# Patient Record
Sex: Female | Born: 1960
Health system: Southern US, Community
[De-identification: ages and names within clinical notes are randomized; demographics above are authoritative.]

## PROBLEM LIST (undated history)

## (undated) DIAGNOSIS — K529 Noninfective gastroenteritis and colitis, unspecified: Secondary | ICD-10-CM

## (undated) DIAGNOSIS — J302 Other seasonal allergic rhinitis: Secondary | ICD-10-CM

## (undated) DIAGNOSIS — M1990 Unspecified osteoarthritis, unspecified site: Secondary | ICD-10-CM

## (undated) DIAGNOSIS — F32A Depression, unspecified: Secondary | ICD-10-CM

## (undated) HISTORY — PX: HYSTEROSCOPY: SHX211

## (undated) HISTORY — PX: COLONOSCOPY: SHX174

## (undated) HISTORY — DX: Other seasonal allergic rhinitis: J30.2

## (undated) HISTORY — DX: Noninfective gastroenteritis and colitis, unspecified: K52.9

## (undated) HISTORY — DX: Depression, unspecified: F32.A

---

## 1997-09-24 ENCOUNTER — Other Ambulatory Visit: Admission: RE | Admit: 1997-09-24 | Discharge: 1997-09-24 | Payer: Self-pay | Admitting: Gynecology

## 1997-10-27 ENCOUNTER — Other Ambulatory Visit: Admission: RE | Admit: 1997-10-27 | Discharge: 1997-10-27 | Payer: Self-pay | Admitting: Gynecology

## 1997-12-23 ENCOUNTER — Ambulatory Visit (HOSPITAL_COMMUNITY): Admission: RE | Admit: 1997-12-23 | Discharge: 1997-12-23 | Payer: Self-pay | Admitting: Gynecology

## 1998-11-01 ENCOUNTER — Other Ambulatory Visit: Admission: RE | Admit: 1998-11-01 | Discharge: 1998-11-01 | Payer: Self-pay | Admitting: Gynecology

## 2000-01-12 ENCOUNTER — Other Ambulatory Visit: Admission: RE | Admit: 2000-01-12 | Discharge: 2000-01-12 | Payer: Self-pay | Admitting: Gynecology

## 2000-07-03 ENCOUNTER — Other Ambulatory Visit: Admission: RE | Admit: 2000-07-03 | Discharge: 2000-07-03 | Payer: Self-pay | Admitting: Gynecology

## 2000-07-23 ENCOUNTER — Encounter (INDEPENDENT_AMBULATORY_CARE_PROVIDER_SITE_OTHER): Payer: Self-pay

## 2000-07-23 ENCOUNTER — Ambulatory Visit (HOSPITAL_COMMUNITY): Admission: RE | Admit: 2000-07-23 | Discharge: 2000-07-23 | Payer: Self-pay | Admitting: Gastroenterology

## 2001-08-11 ENCOUNTER — Encounter: Payer: Self-pay | Admitting: Family Medicine

## 2001-08-11 ENCOUNTER — Encounter: Admission: RE | Admit: 2001-08-11 | Discharge: 2001-08-11 | Payer: Self-pay | Admitting: Family Medicine

## 2001-08-19 ENCOUNTER — Ambulatory Visit (HOSPITAL_COMMUNITY): Admission: RE | Admit: 2001-08-19 | Discharge: 2001-08-19 | Payer: Self-pay | Admitting: Gastroenterology

## 2001-08-19 ENCOUNTER — Encounter (INDEPENDENT_AMBULATORY_CARE_PROVIDER_SITE_OTHER): Payer: Self-pay

## 2001-08-28 ENCOUNTER — Encounter: Admission: RE | Admit: 2001-08-28 | Discharge: 2001-11-26 | Payer: Self-pay | Admitting: Family Medicine

## 2001-10-06 ENCOUNTER — Other Ambulatory Visit: Admission: RE | Admit: 2001-10-06 | Discharge: 2001-10-06 | Payer: Self-pay | Admitting: Gynecology

## 2002-01-07 ENCOUNTER — Encounter: Admission: RE | Admit: 2002-01-07 | Discharge: 2002-01-07 | Payer: Self-pay | Admitting: Family Medicine

## 2002-01-07 ENCOUNTER — Encounter: Payer: Self-pay | Admitting: Family Medicine

## 2005-02-10 ENCOUNTER — Emergency Department (HOSPITAL_COMMUNITY): Admission: EM | Admit: 2005-02-10 | Discharge: 2005-02-10 | Payer: Self-pay | Admitting: Emergency Medicine

## 2005-02-13 ENCOUNTER — Other Ambulatory Visit: Admission: RE | Admit: 2005-02-13 | Discharge: 2005-02-13 | Payer: Self-pay | Admitting: Gynecology

## 2006-02-14 ENCOUNTER — Other Ambulatory Visit: Admission: RE | Admit: 2006-02-14 | Discharge: 2006-02-14 | Payer: Self-pay | Admitting: Gynecology

## 2006-09-04 ENCOUNTER — Other Ambulatory Visit: Admission: RE | Admit: 2006-09-04 | Discharge: 2006-09-04 | Payer: Self-pay | Admitting: Gynecology

## 2007-10-02 ENCOUNTER — Other Ambulatory Visit: Admission: RE | Admit: 2007-10-02 | Discharge: 2007-10-02 | Payer: Self-pay | Admitting: Gynecology

## 2008-01-16 ENCOUNTER — Ambulatory Visit (HOSPITAL_BASED_OUTPATIENT_CLINIC_OR_DEPARTMENT_OTHER): Admission: RE | Admit: 2008-01-16 | Discharge: 2008-01-16 | Payer: Self-pay | Admitting: Gynecology

## 2008-01-16 ENCOUNTER — Encounter: Payer: Self-pay | Admitting: Gynecology

## 2008-04-15 ENCOUNTER — Encounter: Payer: Self-pay | Admitting: Women's Health

## 2008-04-15 ENCOUNTER — Ambulatory Visit: Payer: Self-pay | Admitting: Women's Health

## 2008-04-15 ENCOUNTER — Other Ambulatory Visit: Admission: RE | Admit: 2008-04-15 | Discharge: 2008-04-15 | Payer: Self-pay | Admitting: Gynecology

## 2008-04-18 HISTORY — PX: ENDOMETRIAL ABLATION: SHX621

## 2008-05-10 ENCOUNTER — Ambulatory Visit: Payer: Self-pay | Admitting: Gynecology

## 2008-05-17 ENCOUNTER — Ambulatory Visit: Payer: Self-pay | Admitting: Gynecology

## 2008-06-04 ENCOUNTER — Ambulatory Visit: Payer: Self-pay | Admitting: Gynecology

## 2008-10-04 ENCOUNTER — Encounter: Payer: Self-pay | Admitting: Women's Health

## 2008-10-04 ENCOUNTER — Ambulatory Visit: Payer: Self-pay | Admitting: Women's Health

## 2008-10-04 ENCOUNTER — Other Ambulatory Visit: Admission: RE | Admit: 2008-10-04 | Discharge: 2008-10-04 | Payer: Self-pay | Admitting: Gynecology

## 2009-10-10 ENCOUNTER — Other Ambulatory Visit: Admission: RE | Admit: 2009-10-10 | Discharge: 2009-10-10 | Payer: Self-pay | Admitting: Gynecology

## 2009-10-10 ENCOUNTER — Ambulatory Visit: Payer: Self-pay | Admitting: Women's Health

## 2010-10-12 ENCOUNTER — Encounter: Payer: Self-pay | Admitting: Women's Health

## 2010-10-18 ENCOUNTER — Other Ambulatory Visit: Payer: Self-pay | Admitting: Women's Health

## 2010-10-18 ENCOUNTER — Other Ambulatory Visit (HOSPITAL_COMMUNITY)
Admission: RE | Admit: 2010-10-18 | Discharge: 2010-10-18 | Disposition: A | Discharge status: Home / Self Care | Payer: BC Managed Care – PPO | Source: Ambulatory Visit | Attending: Gynecology | Admitting: Gynecology

## 2010-10-18 ENCOUNTER — Encounter (INDEPENDENT_AMBULATORY_CARE_PROVIDER_SITE_OTHER): Payer: BC Managed Care – PPO | Admitting: Women's Health

## 2010-10-18 DIAGNOSIS — E079 Disorder of thyroid, unspecified: Secondary | ICD-10-CM

## 2010-10-18 DIAGNOSIS — Z13220 Encounter for screening for lipoid disorders: Secondary | ICD-10-CM

## 2010-10-18 DIAGNOSIS — Z01419 Encounter for gynecological examination (general) (routine) without abnormal findings: Secondary | ICD-10-CM

## 2010-10-18 DIAGNOSIS — Z124 Encounter for screening for malignant neoplasm of cervix: Secondary | ICD-10-CM | POA: Insufficient documentation

## 2010-10-18 DIAGNOSIS — Z833 Family history of diabetes mellitus: Secondary | ICD-10-CM

## 2010-10-31 NOTE — Op Note (Signed)
Michelle Huynh, Michelle Huynh                 ACCOUNT NO.:  [id number]   MEDICAL RECORD NO.:  [id number]          PATIENT TYPE:  AMB   LOCATION:  [facility]                         FACILITY:  [facility]   PHYSICIAN:  Timothy P. Fontaine, M.D.DATE OF BIRTH:  Sep 06, 1960   DATE OF PROCEDURE:  01/16/2008  DATE OF DISCHARGE:                               OPERATIVE REPORT   PREOPERATIVE DIAGNOSES:  Irregular bleeding endometrial polyp.   POSTOPERATIVE DIAGNOSES:  Irregular bleeding endometrial polyp.   PROCEDURE:  1. Hysteroscopy.  2. Dilation and curettage.  3. Removal of endometrial polyp.   SURGEON:  Timothy P. Fontaine, M.D.   COMPLICATIONS:  None.   ANESTHETIC:  General with 1% lidocaine paracervical block.   SPECIMEN:  1. Endometrial curetting  2. Endometrial polyp to pathology.   ESTIMATED BLOOD LOSS:  Minimal.  Sorbitol discrepancy reported 75 mL  although a fair amount noted on floor.   FINDINGS:  EUA external BUS of vagina, grossly normal cervix.  Normal  uterus, normal size, midline mobile.  Adnexa without masses.  Hysteroscopic with large endometrial polyp filling the endometrial  cavity, excised in its entirety.  Hysteroscopy otherwise normal noting  fundus anterior, posterior uterine surfaces, lower uterine segment  endocervical canal, right and left tubal ostia all visualized and  normal.   DESCRIPTION OF PROCEDURE:  The patient was taken to the operating room,  underwent general anesthesia and was placed in the low dorsal lithotomy  position, and received a perineal vaginal preparation with Betadine  solution.  Bladder emptied with in-and-out Foley catheterization.  EUA  performed, and the patient was draped in the usual fashion.  Cervix was  visualized with a surgical speculum.  Anterior lip grasped with a single-  tooth tenaculum and a paracervical block using 1% lidocaine.  A total of  9 mL was placed.  Cervix was gently and gradually dilated to admit the  operative  hysteroscope and hysteroscopy was performed with findings  noted above.  Using the right-angle resectoscopic loop, the endometrial  polyp was resected in its entirety at the junction of its attachment to  the surrounding endometrium.  A sharp curettage was then performed.  The  specimen sent separately.  Rehysteroscopy  showed an empty cavity, good distention, no evidence of perforation.  The patient received Toradol intraoperatively, and was placed in the  supine position, awakened without difficulty, and was taken to recovery  room in good condition having tolerated the procedure well.      Timothy P. Fontaine, M.D.  Electronically Signed     TPF/MEDQ  D:  01/16/2008  T:  01/16/2008  Job:  [id number]

## 2010-10-31 NOTE — H&P (Signed)
NAMECARLESHA, Michelle Huynh                 ACCOUNT NO.:  [id number]   MEDICAL RECORD NO.:  [id number]          PATIENT TYPE:  AMB   LOCATION:  [facility]                         FACILITY:  [facility]   PHYSICIAN:  Timothy P. Fontaine, M.D.DATE OF BIRTH:  [birth year]   DATE OF ADMISSION:  01/16/2008  DATE OF DISCHARGE:                              HISTORY & PHYSICAL   DATE OF ADMISSION:  January 16, 2008, at 7:30, [facility].   CHIEF COMPLAINT:  Irregular bleeding.   HISTORY OF PRESENT ILLNESS:  A 50 year old G0 female, vasectomy birth  control, notes periods were becoming progressively closer.  She  underwent ultrasound evaluation which showed a posterior wall  endometrial polyp measuring 15 x 14 x 7 mm.  She is admitted at this  time for hysteroscopy, D&C and removal of her polyp.  She does have a  past history of polypectomy in 1999.   PAST MEDICAL HISTORY:  Significant for colitis.   PAST SURGICAL HISTORY:  1. Hysteroscopy.  2. D&C.   CURRENT MEDICATIONS:  Asacol.   ALLERGIES:  FLAGYL.   REVIEW OF SYSTEMS:  Noncontributory.   SOCIAL HISTORY:  Noncontributory.   FAMILY HISTORY:  Noncontributory.   PHYSICAL EXAMINATION:  VITAL SIGNS:  Stable.  HEENT:  Normal.  LUNGS:  Clear.  CARDIAC:  Regular rate.  No rubs, murmurs or gallops.  ABDOMEN:  Benign.  PELVIC:  External BUS, vagina normal.  Cervix normal.  Uterus normal  size, midline and mobile, nontender.  Adnexa without masses or  tenderness.   ASSESSMENT:  A 50 year old G0, vasectomy birth control, recurrent  endometrial polyp, irregular bleeding for hysteroscopy, dilatation and  curettage.  I reviewed with the patient the proposed surgery, to include  the use of the hysteroscope, dilatation and curettage, use of the  resectoscope.  I reviewed the risks to include bleeding, transfusion,  infection, uterine perforation, damage to internal organs, including  bowel, bladder, ureters, vessels and nerves, necessitating  major  exploratory reparative surgeries, future reparative surgeries, bowel  resection, ostomy formation all reviewed, understood and accepted.  The  potential for distended media absorption leading to metabolic  complications such as seizure and coma were also discussed, understood  and accepted.  The patient was given a  prescription for Darvocet-N 100 #20 one to two p.o. q.4-6 h., p.r.n.  pain for postoperative relief, as well as Cytotec 200 mcg tablet be  placed the evening before procedure to assist with cervical dilatation.  The patient's questions were answered to her satisfaction.  She is ready  to proceed with surgery.      Timothy P. Fontaine, M.D.  Electronically Signed     TPF/MEDQ  D:  01/14/2008  T:  01/14/2008  Job:  [id number]

## 2011-01-09 ENCOUNTER — Telehealth: Payer: Self-pay

## 2011-01-09 NOTE — Telephone Encounter (Signed)
PT. STATES X 2 MONTHS GENERIC PROZAC 10MG  DOES NOT SEEM TO BE HELPING AT ALL AND WANTS TO DISCUSS INCREASED DOSE OR CHANGE TO A DIFFERENT RX. WILL TRY AT LOCAL PHARMACY. IF WORKS WELL WILL NOTIFY TO SEND NEW RX TO [facility].

## 2011-01-10 ENCOUNTER — Telehealth: Payer: Self-pay | Admitting: Women's Health

## 2011-01-10 DIAGNOSIS — F329 Major depressive disorder, single episode, unspecified: Secondary | ICD-10-CM

## 2011-01-10 DIAGNOSIS — F32A Depression, unspecified: Secondary | ICD-10-CM

## 2011-01-10 MED ORDER — FLUOXETINE HCL 20 MG PO CAPS: 20.0000 mg | ORAL_CAPSULE | Freq: Every day | ORAL | Status: DC

## 2011-01-10 NOTE — Telephone Encounter (Signed)
Yes thanks 

## 2011-01-10 NOTE — Telephone Encounter (Signed)
Telephone call to the patient to review medication request. She has been on Prozac 10 mg for about 2 years and she states it is helped. States it is not as effective as at once was. Denies any feelings of harming herself or other situational stressors at this time. Denies need for counseling at this time. Will increase Prilosec to 20 mg by mouth daily and will get called in to her pharmacy. Will call if no relief with the increased dose. Encouraged to relax more exercise, and care for self better. We'll call with no relief.

## 2011-01-10 NOTE — Telephone Encounter (Signed)
IN YOUR DOCUMENTATION YOU MENTIONED INCREASING PRILOSEC TO 20 MG. DID YOU MEAN THE PROZAC RX?

## 2011-02-02 ENCOUNTER — Other Ambulatory Visit: Payer: Self-pay

## 2011-02-02 DIAGNOSIS — F329 Major depressive disorder, single episode, unspecified: Secondary | ICD-10-CM

## 2011-02-02 MED ORDER — FLUOXETINE HCL 20 MG PO CAPS: 20.0000 mg | ORAL_CAPSULE | Freq: Every day | ORAL | Status: DC

## 2011-02-02 NOTE — Telephone Encounter (Signed)
PLEASE SIGN FAX & HAVE DR. FONTAINE SIGN NEW DOSE OF RX DUE TO [place] LAW WHERE [facility] IS LOCATED.

## 2011-02-02 NOTE — Telephone Encounter (Signed)
PER NANCY AND DR. FONTAINE, THEY SIGNED RX AND I FAXED IT TO [facility] AT [phone].

## 2011-04-16 ENCOUNTER — Telehealth: Payer: Self-pay | Admitting: *Deleted

## 2011-04-16 NOTE — Telephone Encounter (Signed)
Amy, please call and have her take Valtrex 500 twice daily when she has fever blisters. Instruct  her to take immediately when she feels one starting to help prevent it from forming. Please call in Valtrex 500 twice a day 3-5 days when necessary #30.  Office visit if no relief.

## 2011-04-16 NOTE — Telephone Encounter (Signed)
Patient called c/o "canker sores" over the past couple weeks that keeps coming back.  Wants to get some advice on this please.

## 2011-04-17 MED ORDER — VALACYCLOVIR HCL 500 MG PO TABS: 500.0000 mg | ORAL_TABLET | Freq: Two times a day (BID) | ORAL | Status: AC

## 2011-04-17 NOTE — Telephone Encounter (Signed)
Patient informed. Rx sent 

## 2011-04-20 ENCOUNTER — Telehealth: Payer: Self-pay

## 2011-04-20 DIAGNOSIS — K529 Noninfective gastroenteritis and colitis, unspecified: Secondary | ICD-10-CM | POA: Insufficient documentation

## 2011-04-20 NOTE — Telephone Encounter (Signed)
Left msg pt to call me

## 2011-04-20 NOTE — Telephone Encounter (Signed)
Patient called on 04/16/11 and was prescribed Valtrex 500 bid to help with cold sore.  She said that she is no better after using the Valtrex. She asked if you could recommend anything for the pain as "it is in a bad spot".  Tylenol not helping.

## 2011-04-20 NOTE — Telephone Encounter (Signed)
Telephone call to review problem. States has had mouth ulcers throughout mouth off and on for the last 3 or 4 months. Has never had HSV. Has had colitis but states that is under control at this time. Did review to avoid acidic foods, carbonated, mouthwashes with any alcohol. States her life is good, has had no recent illnesses or increased stressors. Will watch at this time, if they persist office visit.

## 2011-04-20 NOTE — Telephone Encounter (Signed)
Please call patient :Motrin may be a better choice for pain relief since it is also and anti-inflammatory. Could also try Zovirax 5% ointment to or 3 times per day, if no relief office visit.  Please call in Motrin 600 by mouth every 8 hours when necessary #60 no refills. Zovirax 5% ointment 2-3 times per day when necessary #1 tube

## 2011-04-20 NOTE — Telephone Encounter (Signed)
Patient clarified today what she calls "canker sores" are the little while ulcers inside of her mouth. She does not have a cold sore on her lips.  She mentioned the pharmacist said he had never heard of prescribing Valtrex for it.  I told her she was correct and there was some confusion when she called in canker sore that it was thought she had cold sore. I told her not to use the Valtrex for that anymore.  She cannot take Motrin because of her colitis.  We discussed Oragel, she tried [employer], etc. And I told her that there is not one definitive treatment for those little mouth ulcers.  She said I only needed to call her back if [place] had any other suggestions.

## 2011-05-15 ENCOUNTER — Encounter: Payer: Self-pay | Admitting: Women's Health

## 2011-10-24 ENCOUNTER — Ambulatory Visit (INDEPENDENT_AMBULATORY_CARE_PROVIDER_SITE_OTHER): Payer: BC Managed Care – PPO | Admitting: Women's Health

## 2011-10-24 ENCOUNTER — Encounter: Payer: Self-pay | Admitting: Women's Health

## 2011-10-24 VITALS — BP 118/78 | Ht 63.5 in | Wt 180.5 lb

## 2011-10-24 DIAGNOSIS — Z9889 Other specified postprocedural states: Secondary | ICD-10-CM

## 2011-10-24 DIAGNOSIS — N926 Irregular menstruation, unspecified: Secondary | ICD-10-CM

## 2011-10-24 DIAGNOSIS — Z833 Family history of diabetes mellitus: Secondary | ICD-10-CM

## 2011-10-24 DIAGNOSIS — F3289 Other specified depressive episodes: Secondary | ICD-10-CM

## 2011-10-24 DIAGNOSIS — Z01419 Encounter for gynecological examination (general) (routine) without abnormal findings: Secondary | ICD-10-CM

## 2011-10-24 DIAGNOSIS — F329 Major depressive disorder, single episode, unspecified: Secondary | ICD-10-CM

## 2011-10-24 DIAGNOSIS — E079 Disorder of thyroid, unspecified: Secondary | ICD-10-CM

## 2011-10-24 DIAGNOSIS — Z13220 Encounter for screening for lipoid disorders: Secondary | ICD-10-CM

## 2011-10-24 DIAGNOSIS — F32A Depression, unspecified: Secondary | ICD-10-CM

## 2011-10-24 LAB — CBC WITH DIFFERENTIAL/PLATELET
Eosinophils Absolute: 0.2 10*3/uL (ref 0.0–0.7)
Eosinophils Relative: 2 % (ref 0–5)
Hemoglobin: 14.7 g/dL (ref 12.0–15.0)
Lymphs Abs: 1.9 10*3/uL (ref 0.7–4.0)
MCH: 33 pg (ref 26.0–34.0)
MCV: 97.8 fL (ref 78.0–100.0)
Monocytes Relative: 9 % (ref 3–12)
RBC: 4.46 MIL/uL (ref 3.87–5.11)

## 2011-10-24 LAB — TSH: TSH: 1.237 u[IU]/mL (ref 0.350–4.500)

## 2011-10-24 LAB — FOLLICLE STIMULATING HORMONE: FSH: 15.2 m[IU]/mL

## 2011-10-24 LAB — LIPID PANEL
HDL: 42 mg/dL (ref >39)
LDL Cholesterol: 145 mg/dL — ABNORMAL HIGH (ref 0–99)
Total CHOL/HDL Ratio: 5.1 Ratio

## 2011-10-24 MED ORDER — FLUOXETINE HCL 20 MG PO CAPS: 20.0000 mg | ORAL_CAPSULE | Freq: Every day | ORAL | Status: DC

## 2011-10-24 NOTE — Patient Instructions (Signed)

## 2011-10-24 NOTE — Progress Notes (Signed)
Michelle Huynh July 09, 1960 [id number]    History:    The patient presents for annual exam.  Light one-day cycle every 3 months, PMS type symptoms monthly, denies menopausal symptoms. History of HER option November of 2009. History of normal Paps and mammograms. History of colitis, last colonoscopy without polyps in 2010, has colonoscopies every 5 years. Currently on Prozac 20 mg and doing well. Uses no contraception and has never had a pregnancy. Same partner for years.   Past medical history, past surgical history, family history and social history were all reviewed and documented in the EPIC chart. History of a benign uterine polyp in 99 and 2009.   ROS:  A  ROS was performed and pertinent positives and negatives are included in the history.  Exam:  Filed Vitals:   10/24/11 0856  BP: 118/78    General appearance:  Normal Head/Neck:  Normal, without cervical or supraclavicular adenopathy. Thyroid:  Symmetrical, normal in size, without palpable masses or nodularity. Respiratory  Effort:  Normal  Auscultation:  Clear without wheezing or rhonchi Cardiovascular  Auscultation:  Regular rate, without rubs, murmurs or gallops  Edema/varicosities:  Not grossly evident Abdominal  Soft,nontender, without masses, guarding or rebound.  Liver/spleen:  No organomegaly noted  Hernia:  None appreciated  Skin  Inspection:  Grossly normal  Palpation:  Grossly normal Neurologic/psychiatric  Orientation:  Normal with appropriate conversation.  Mood/affect:  Normal  Genitourinary    Breasts: Examined lying and sitting.     Right: Without masses, retractions, discharge or axillary adenopathy.     Left: Without masses, retractions, discharge or axillary adenopathy.   Inguinal/mons:  Normal without inguinal adenopathy  External genitalia:  Normal  BUS/Urethra/Skene's glands:  Normal  Bladder:  Normal  Vagina:  Normal  Cervix:  Normal  Uterus:   normal in size, shape and contour.  Midline and  mobile  Adnexa/parametria:     Rt: Without masses or tenderness.   Lt: Without masses or tenderness.  Anus and perineum: Normal  Digital rectal exam: Normal sphincter tone without palpated masses or tenderness  Assessment/Plan:  51 y.o. DW F. G0 for annual exam.  Normal perimenopausal exam/HER option 2009 Anxiety/stable on Prozac 20 mg daily Colitis Dr. Orvilla Cornwall  Plan: CBC, glucose, lipid profile, TSH, FSH, UA. ACOG's  Pap screening recommendations reviewed. SBE's, continue annual mammogram, calcium rich diet, vitamin D 1000 daily, decrease calories for weight loss and exercise encouraged. Prozac 20 prescription, proper use, importance of exercise and leisure activities reviewed. Menopause reviewed, no bothersome symptoms at this time.   Harrington Challenger [facility], 9:55 AM 10/24/2011

## 2011-10-25 ENCOUNTER — Other Ambulatory Visit: Payer: Self-pay | Admitting: *Deleted

## 2011-10-25 DIAGNOSIS — F329 Major depressive disorder, single episode, unspecified: Secondary | ICD-10-CM

## 2011-10-25 LAB — URINALYSIS W MICROSCOPIC + REFLEX CULTURE
Bacteria, UA: NONE SEEN
Glucose, UA: NEGATIVE mg/dL
Hgb urine dipstick: NEGATIVE
Leukocytes, UA: NEGATIVE
Nitrite: NEGATIVE
Protein, ur: NEGATIVE mg/dL

## 2011-10-25 MED ORDER — FLUOXETINE HCL 20 MG PO CAPS: 20.0000 mg | ORAL_CAPSULE | Freq: Every day | ORAL | Status: DC

## 2011-10-25 NOTE — Progress Notes (Signed)
Sent 90 day rx to new mail order company

## 2012-02-11 ENCOUNTER — Other Ambulatory Visit: Payer: Self-pay | Admitting: *Deleted

## 2012-02-11 DIAGNOSIS — F329 Major depressive disorder, single episode, unspecified: Secondary | ICD-10-CM

## 2012-02-11 MED ORDER — FLUOXETINE HCL 20 MG PO CAPS: 20.0000 mg | ORAL_CAPSULE | Freq: Every day | ORAL | Status: DC

## 2012-05-09 ENCOUNTER — Encounter: Payer: Self-pay | Admitting: Women's Health

## 2012-10-24 ENCOUNTER — Encounter: Payer: Self-pay | Admitting: Women's Health

## 2012-10-24 ENCOUNTER — Encounter: Payer: Self-pay | Admitting: Gynecology

## 2012-10-24 ENCOUNTER — Other Ambulatory Visit (HOSPITAL_COMMUNITY)
Admission: RE | Admit: 2012-10-24 | Discharge: 2012-10-24 | Disposition: A | Discharge status: Home / Self Care | Payer: BC Managed Care – PPO | Source: Ambulatory Visit | Attending: Obstetrics and Gynecology | Admitting: Obstetrics and Gynecology

## 2012-10-24 ENCOUNTER — Ambulatory Visit (INDEPENDENT_AMBULATORY_CARE_PROVIDER_SITE_OTHER): Payer: BC Managed Care – PPO | Admitting: Women's Health

## 2012-10-24 VITALS — BP 126/82 | Ht 63.0 in | Wt 180.0 lb

## 2012-10-24 DIAGNOSIS — N912 Amenorrhea, unspecified: Secondary | ICD-10-CM

## 2012-10-24 DIAGNOSIS — E079 Disorder of thyroid, unspecified: Secondary | ICD-10-CM

## 2012-10-24 DIAGNOSIS — F329 Major depressive disorder, single episode, unspecified: Secondary | ICD-10-CM

## 2012-10-24 DIAGNOSIS — Z833 Family history of diabetes mellitus: Secondary | ICD-10-CM

## 2012-10-24 DIAGNOSIS — Z01419 Encounter for gynecological examination (general) (routine) without abnormal findings: Secondary | ICD-10-CM | POA: Insufficient documentation

## 2012-10-24 DIAGNOSIS — Z13220 Encounter for screening for lipoid disorders: Secondary | ICD-10-CM

## 2012-10-24 LAB — CBC WITH DIFFERENTIAL/PLATELET
Basophils Absolute: 0 10*3/uL (ref 0.0–0.1)
Basophils Relative: 0 % (ref 0–1)
Eosinophils Relative: 2 % (ref 0–5)
HCT: 41.7 % (ref 36.0–46.0)
Hemoglobin: 14.2 g/dL (ref 12.0–15.0)
MCHC: 34.1 g/dL (ref 30.0–36.0)
MCV: 94.1 fL (ref 78.0–100.0)
Monocytes Absolute: 0.4 10*3/uL (ref 0.1–1.0)
Monocytes Relative: 9 % (ref 3–12)
Neutro Abs: 2.4 10*3/uL (ref 1.7–7.7)
RDW: 14.1 % (ref 11.5–15.5)

## 2012-10-24 LAB — LIPID PANEL
Cholesterol: 203 mg/dL — ABNORMAL HIGH (ref 0–200)
LDL Cholesterol: 138 mg/dL — ABNORMAL HIGH (ref 0–99)
Total CHOL/HDL Ratio: 4.6 Ratio
Triglycerides: 103 mg/dL (ref <150)
VLDL: 21 mg/dL (ref 0–40)

## 2012-10-24 LAB — URINALYSIS W MICROSCOPIC + REFLEX CULTURE
Bacteria, UA: NONE SEEN
Bilirubin Urine: NEGATIVE
Ketones, ur: NEGATIVE mg/dL
Nitrite: NEGATIVE
Protein, ur: NEGATIVE mg/dL
Specific Gravity, Urine: 1.026 (ref 1.005–1.030)
Urobilinogen, UA: 1 mg/dL (ref 0.0–1.0)

## 2012-10-24 LAB — TSH: TSH: 0.877 u[IU]/mL (ref 0.350–4.500)

## 2012-10-24 MED ORDER — FLUOXETINE HCL 20 MG PO CAPS: 20.0000 mg | ORAL_CAPSULE | Freq: Every day | ORAL | Status: DC

## 2012-10-24 NOTE — Patient Instructions (Addendum)
Vit D 2000 daily 1500 Calorie Diabetic Diet The 1500 calorie diabetic diet limits calories to 1500 each day. Following this diet and making healthy meal choices can help improve overall health. It controls blood glucose (sugar) levels and can also help lower blood pressure and cholesterol.  SERVING SIZES Measuring foods and serving sizes helps to make sure you are getting the right amount of food. The list below tells how big or small some common serving sizes are.  1 oz.........4 stacked dice.  3 oz........Marland KitchenDeck of cards.  1 tsp.......Marland KitchenTip of little finger.  1 tbs......Marland KitchenMarland KitchenThumb.  2 tbs.......Marland KitchenGolf ball.   cup......Marland KitchenHalf of a fist.  1 cup.......Marland KitchenA fist. GUIDELINES FOR CHOOSING FOODS The goal of this diet is to eat a variety of foods and limit calories to 1500 each day. This can be done by choosing foods that are low in calories and fat. The diet also suggests eating small amounts of food frequently. Doing this helps control your blood glucose levels, so they do not get too high or too low. Each meal or snack may include a protein food source to help you feel more satisfied. Try to eat about the same amount of food around the same time each day. This includes weekend days, travel days, and days off work. Space your meals about 4 to 5 hours apart, and add a snack between them, if you wish.  For example, a daily food plan could include breakfast, a morning snack, lunch, dinner, and an evening snack. Healthy meals and snacks have different types of foods, including whole grains, vegetables, fruits, lean meats, poultry, fish, and dairy products. As you plan your meals, select a variety of foods. Choose from the bread and starch, vegetable, fruit, dairy, and meat/protein groups. Examples of foods from each group are listed below, with their suggested serving sizes. Use measuring cups and spoons to become familiar with what a healthy portion looks like. Bread and Starch Each serving equals 15 grams  of carbohydrate.  1 slice bread.   bagel.   cup cold cereal (unsweetened).   cup hot cereal or mashed potatoes.  1 small potato (size of a computer mouse).   cup cooked pasta or rice.   English muffin.  1 cup broth-based soup.  3 cups of popcorn.  4 to 6 whole-wheat crackers.   cup cooked beans, peas, or corn. Vegetables Each serving equals 5 grams of carbohydrate.   cup cooked vegetables.  1 cup raw vegetables.   cup tomato or vegetable juice. Fruit Each serving equals 15 grams of carbohydrate.  1 small apple or orange.  1  cup watermelon or strawberries.   cup applesauce (no sugar added).  2 tbs raisins.   banana.   cup canned fruit, packed in water or in its own juice.   cup unsweetened fruit juice. Dairy Each serving equals 12 to 15 grams of carbohydrate.  1 cup fat-free milk.  6 oz artificially sweetened yogurt or plain yogurt.  1 cup low-fat buttermilk.  1 cup soy milk.  1 cup almond milk. Meat/Protein  1 large egg.  2 to 3 oz meat, poultry, or fish.   cup low-fat cottage cheese.  1 tbs peanut butter.  1 oz low-fat cheese.   cup tuna, packed in water.   cup tofu. Fat  1 tsp oil.  1 tsp trans-fat-free margarine.  1 tsp butter.  1 tsp mayonnaise.  2 tbs avocado.  1 tbs salad dressing.  1 tbs cream cheese.  2 tbs sour cream.  SAMPLE 1500 CALORIE DIET PLAN Breakfast   whole-wheat English muffin (1 carb serving).  1 tsp trans-fat-free margarine.  1 scrambled egg.  1 cup fat-free milk (1 carb serving).  1 small orange (1 carb serving). Lunch  Chicken wrap.  1 whole-wheat tortilla, 8-inch (1 carb servings).  2 oz chicken breast, sliced.  2 tbs low-fat salad dressing, such as [place].   cup shredded lettuce.  2 slices tomato.   cup carrot sticks.  1 small apple (1 carb serving). Afternoon Snack  3 graham cracker squares (1 carb serving).  1 tbs peanut butter. Dinner  2 oz  lean pork chop, broiled.  1 cup brown rice (3 carb servings).   cup steamed carrots.   cup green beans.  1 cup fat-free milk (1 carb serving).  1 tsp trans-fat-free margarine. Evening Snack   cup low-fat cottage cheese.  1 small peach or pear, sliced (or  cup canned in water) (1 carb serving). MEAL PLAN You can use this worksheet to help you make a daily meal plan based on the 1500 calorie diabetic diet suggestions. If you are using this plan to help you control your blood glucose, you may interchange carbohydrate containing foods (dairy, starches, and fruits). Select a variety of fresh foods of varying colors and flavors. The total amount of carbohydrate in your meals or snacks is more important than making sure you include all of the food groups every time you eat. You can choose from approximately this many of the following foods to build your day's meals:  6 Starches.  3 Vegetables.  2 Fruits.  2 Dairy.  4 to 6 oz Meat/Protein.  Up to 3 Fats. Your dietician can use this worksheet to help you decide how many servings and which types of foods are right for you. BREAKFAST Food Group and Servings / Food Choice Starch _________________________________________________________ Dairy __________________________________________________________ Fruit ___________________________________________________________ Meat/Protein____________________________________________________ Fat ____________________________________________________________ LUNCH Food Group and Servings / Food Choice  Starch _________________________________________________________ Meat/Protein ___________________________________________________ Vegetables _____________________________________________________ Fruit __________________________________________________________ Dairy __________________________________________________________ Fat ____________________________________________________________ Aura Fey Food Group and Servings / Food Choice Dairy __________________________________________________________ Starch _________________________________________________________ Meat/Protein____________________________________________________ Zada Girt ___________________________________________________________ Laural Golden Food Group and Servings / Food Choice Starch _________________________________________________________ Meat/Protein ___________________________________________________ Dairy __________________________________________________________ Vegetable ______________________________________________________ Fruit ___________________________________________________________ Fat ____________________________________________________________ Lollie Sails Food Group and Servings / Food Choice Fruit ___________________________________________________________ Meat/Protein ____________________________________________________ Dairy __________________________________________________________ Starch __________________________________________________________ DAILY TOTALS Starches _________________________ Vegetables _______________________ Fruits ____________________________ Dairy ____________________________ Meat/Protein_____________________ Fats _____________________________ Document Released: 12/25/2004 Document Revised: 08/27/2011 Document Reviewed: 04/21/2009 Exit[employer] Patient Information 2013 [place], [place].

## 2012-10-24 NOTE — Addendum Note (Signed)
Addended by: Richardson Chiquito on: 10/24/2012 10:34 AM   Modules accepted: Orders

## 2012-10-24 NOTE — Progress Notes (Signed)
MILANNA KOZLOV [birth year] [id number]    History:    The patient presents for annual exam.  Rare light cycle, her option 04/2008 for menorrhagia with good relief. History of colitis, negative colonoscopy 2010. Prozac 20 for anxiety/depression with good relief of symptoms. Normal Pap and mammogram history.   Past medical history, past surgical history, family history and social history were all reviewed and documented in the EPIC chart. Works at [employer]. Engaged, planning wedding next year. Mother diabetes.   ROS:  A  ROS was performed and pertinent positives and negatives are included in the history.  Exam:  Filed Vitals:   10/24/12 0858  BP: 126/82    General appearance:  Normal Head/Neck:  Normal, without cervical or supraclavicular adenopathy. Thyroid:  Symmetrical, normal in size, without palpable masses or nodularity. Respiratory  Effort:  Normal  Auscultation:  Clear without wheezing or rhonchi Cardiovascular  Auscultation:  Regular rate, without rubs, murmurs or gallops  Edema/varicosities:  Not grossly evident Abdominal  Soft,nontender, without masses, guarding or rebound.  Liver/spleen:  No organomegaly noted  Hernia:  None appreciated  Skin  Inspection:  Grossly normal  Palpation:  Grossly normal Neurologic/psychiatric  Orientation:  Normal with appropriate conversation.  Mood/affect:  Normal  Genitourinary    Breasts: Examined lying and sitting.     Right: Without masses, retractions, discharge or axillary adenopathy.     Left: Without masses, retractions, discharge or axillary adenopathy.   Inguinal/mons:  Normal without inguinal adenopathy  External genitalia:  Normal  BUS/Urethra/Skene's glands:  Normal  Bladder:  Normal  Vagina:  Normal  Cervix:  Normal  Uterus:   normal in size, shape and contour.  Midline and mobile  Adnexa/parametria:     Rt: Without masses or tenderness.   Lt: Without masses or tenderness.  Anus and perineum: Normal  Digital  rectal exam: Normal sphincter tone without palpated masses or tenderness  Assessment/Plan:  52 y.o. DWF G0 for annual exam with no complaints.  Normal GYN exam Colitis stable on Asacol Anxiety/depression-stable on Prozac  Plan: FSH, CBC, glucose, lipid panel, UA, TSH, Pap. Pap normal 2012. New screening guidelines reviewed. Menopause reviewed, minimal symptoms. SBE's, continue annual mammogram, calcium rich diet, vitamin D 2000 daily encouraged. Increase regular exercise and decrease calories for weight loss. Prozac 20 mg by mouth daily prescription, proper use given and reviewed, counseling as needed declines need at this time has had in the past.    Harrington Challenger [facility], 9:36 AM 10/24/2012

## 2013-01-28 ENCOUNTER — Encounter: Payer: Self-pay | Admitting: Women's Health

## 2013-04-03 ENCOUNTER — Telehealth: Payer: Self-pay | Admitting: *Deleted

## 2013-04-03 MED ORDER — VALACYCLOVIR HCL 500 MG PO TABS: ORAL_TABLET | ORAL | Status: DC

## 2013-04-03 NOTE — Telephone Encounter (Signed)
Okay,.Valtrex 500 twice daily for 3-5 days as needed please call and #30 with refills.

## 2013-04-03 NOTE — Telephone Encounter (Signed)
Left message pt to call.rx sent, pt informed

## 2013-04-03 NOTE — Telephone Encounter (Signed)
Pt was seen on 10/24/12 for annual spoke with you about cold sores and her dentist give pt some valtrex to help with this. Pt has switched dentist and would like to know if you be willing to prescribe this? Please advise

## 2013-04-23 ENCOUNTER — Other Ambulatory Visit: Payer: Self-pay

## 2013-10-28 ENCOUNTER — Encounter: Payer: BC Managed Care – PPO | Admitting: Women's Health

## 2013-11-04 ENCOUNTER — Encounter: Payer: Self-pay | Admitting: Women's Health

## 2013-11-04 ENCOUNTER — Ambulatory Visit (INDEPENDENT_AMBULATORY_CARE_PROVIDER_SITE_OTHER): Payer: BC Managed Care – PPO | Admitting: Women's Health

## 2013-11-04 VITALS — BP 120/78 | Ht 63.0 in | Wt 193.0 lb

## 2013-11-04 DIAGNOSIS — F3289 Other specified depressive episodes: Secondary | ICD-10-CM

## 2013-11-04 DIAGNOSIS — Z13220 Encounter for screening for lipoid disorders: Secondary | ICD-10-CM

## 2013-11-04 DIAGNOSIS — F32A Depression, unspecified: Secondary | ICD-10-CM

## 2013-11-04 DIAGNOSIS — E785 Hyperlipidemia, unspecified: Secondary | ICD-10-CM

## 2013-11-04 DIAGNOSIS — F329 Major depressive disorder, single episode, unspecified: Secondary | ICD-10-CM

## 2013-11-04 DIAGNOSIS — B009 Herpesviral infection, unspecified: Secondary | ICD-10-CM

## 2013-11-04 DIAGNOSIS — E079 Disorder of thyroid, unspecified: Secondary | ICD-10-CM

## 2013-11-04 DIAGNOSIS — Z01419 Encounter for gynecological examination (general) (routine) without abnormal findings: Secondary | ICD-10-CM

## 2013-11-04 LAB — LIPID PANEL
CHOL/HDL RATIO: 4.3 ratio
CHOLESTEROL: 190 mg/dL (ref 0–200)
HDL: 44 mg/dL (ref >39)
LDL Cholesterol: 127 mg/dL — ABNORMAL HIGH (ref 0–99)
Triglycerides: 94 mg/dL (ref <150)
VLDL: 19 mg/dL (ref 0–40)

## 2013-11-04 LAB — CBC WITH DIFFERENTIAL/PLATELET
Basophils Absolute: 0 10*3/uL (ref 0.0–0.1)
Basophils Relative: 0 % (ref 0–1)
EOS PCT: 2 % (ref 0–5)
Eosinophils Absolute: 0.1 10*3/uL (ref 0.0–0.7)
HCT: 41 % (ref 36.0–46.0)
HEMOGLOBIN: 14 g/dL (ref 12.0–15.0)
LYMPHS ABS: 1.1 10*3/uL (ref 0.7–4.0)
Lymphocytes Relative: 25 % (ref 12–46)
MCH: 32 pg (ref 26.0–34.0)
MCHC: 34.1 g/dL (ref 30.0–36.0)
MCV: 93.6 fL (ref 78.0–100.0)
MONOS PCT: 12 % (ref 3–12)
Monocytes Absolute: 0.5 10*3/uL (ref 0.1–1.0)
Neutro Abs: 2.7 10*3/uL (ref 1.7–7.7)
Neutrophils Relative %: 61 % (ref 43–77)
Platelets: 208 10*3/uL (ref 150–400)
RBC: 4.38 MIL/uL (ref 3.87–5.11)
RDW: 13.8 % (ref 11.5–15.5)
WBC: 4.5 10*3/uL (ref 4.0–10.5)

## 2013-11-04 LAB — COMPREHENSIVE METABOLIC PANEL
ALT: 27 U/L (ref 0–35)
AST: 27 U/L (ref 0–37)
Albumin: 4.1 g/dL (ref 3.5–5.2)
Alkaline Phosphatase: 74 U/L (ref 39–117)
BUN: 12 mg/dL (ref 6–23)
CALCIUM: 9.1 mg/dL (ref 8.4–10.5)
CO2: 26 meq/L (ref 19–32)
CREATININE: 0.85 mg/dL (ref 0.50–1.10)
Chloride: 106 mEq/L (ref 96–112)
GLUCOSE: 93 mg/dL (ref 70–99)
Potassium: 4.3 mEq/L (ref 3.5–5.3)
Sodium: 139 mEq/L (ref 135–145)
Total Bilirubin: 0.4 mg/dL (ref 0.2–1.2)
Total Protein: 6.7 g/dL (ref 6.0–8.3)

## 2013-11-04 LAB — TSH: TSH: 0.832 u[IU]/mL (ref 0.350–4.500)

## 2013-11-04 MED ORDER — FLUOXETINE HCL 20 MG PO CAPS: 20.0000 mg | ORAL_CAPSULE | Freq: Every day | ORAL | Status: DC

## 2013-11-04 MED ORDER — VALACYCLOVIR HCL 500 MG PO TABS: ORAL_TABLET | ORAL | Status: DC

## 2013-11-04 NOTE — Patient Instructions (Signed)
Health Recommendations for Postmenopausal Women Respected and ongoing research has looked at the most common causes of death, disability, and poor quality of life in postmenopausal women. The causes include heart disease, diseases of blood vessels, diabetes, depression, cancer, and bone loss (osteoporosis). Many things can be done to help lower the chances of developing these and other common problems: CARDIOVASCULAR DISEASE Heart Disease: A heart attack is a medical emergency. Know the signs and symptoms of a heart attack. Below are things women can do to reduce their risk for heart disease.   Do not smoke. If you smoke, quit.  Aim for a healthy weight. Being overweight causes many preventable deaths. Eat a healthy and balanced diet and drink an adequate amount of liquids.  Get moving. Make a commitment to be more physically active. Aim for 30 minutes of activity on most, if not all days of the week.  Eat for heart health. Choose a diet that is low in saturated fat and cholesterol and eliminate trans fat. Include whole grains, vegetables, and fruits. Read and understand the labels on food containers before buying.  Know your numbers. Ask your caregiver to check your blood pressure, cholesterol (total, HDL, LDL, triglycerides) and blood glucose. Work with your caregiver on improving your entire clinical picture.  High blood pressure. Limit or stop your table salt intake (try salt substitute and food seasonings). Avoid salty foods and drinks. Read labels on food containers before buying. Eating well and exercising can help control high blood pressure. STROKE  Stroke is a medical emergency. Stroke may be the result of a blood clot in a blood vessel in the brain or by a brain hemorrhage (bleeding). Know the signs and symptoms of a stroke. To lower the risk of developing a stroke:  Avoid fatty foods.  Quit smoking.  Control your diabetes, blood pressure, and irregular heart rate. THROMBOPHLEBITIS  (BLOOD CLOT) OF THE LEG  Becoming overweight and leading a stationary lifestyle may also contribute to developing blood clots. Controlling your diet and exercising will help lower the risk of developing blood clots. CANCER SCREENING  Breast Cancer: Take steps to reduce your risk of breast cancer.  You should practice "breast self-awareness." This means understanding the normal appearance and feel of your breasts and should include breast self-examination. Any changes detected, no matter how small, should be reported to your caregiver.  After age 40, you should have a clinical breast exam (CBE) every year.  Starting at age 40, you should consider having a mammogram (breast X-ray) every year.  If you have a family history of breast cancer, talk to your caregiver about genetic screening.  If you are at high risk for breast cancer, talk to your caregiver about having an MRI and a mammogram every year.  Intestinal or Stomach Cancer: Tests to consider are a rectal exam, fecal occult blood, sigmoidoscopy, and colonoscopy. Women who are high risk may need to be screened at an earlier age and more often.  Cervical Cancer:  Beginning at age 30, you should have a Pap test every 3 years as long as the past 3 Pap tests have been normal.  If you have had past treatment for cervical cancer or a condition that could lead to cancer, you need Pap tests and screening for cancer for at least 20 years after your treatment.  If you had a hysterectomy for a problem that was not cancer or a condition that could lead to cancer, then you no longer need Pap tests.    If you are between ages 65 and 70, and you have had normal Pap tests going back 10 years, you no longer need Pap tests.  If Pap tests have been discontinued, risk factors (such as a new sexual partner) need to be reassessed to determine if screening should be resumed.  Some medical problems can increase the chance of getting cervical cancer. In these  cases, your caregiver may recommend more frequent screening and Pap tests.  Uterine Cancer: If you have vaginal bleeding after reaching menopause, you should notify your caregiver.  Ovarian cancer: Other than yearly pelvic exams, there are no reliable tests available to screen for ovarian cancer at this time except for yearly pelvic exams.  Lung Cancer: Yearly chest X-rays can detect lung cancer and should be done on high risk women, such as cigarette smokers and women with chronic lung disease (emphysema).  Skin Cancer: A complete body skin exam should be done at your yearly examination. Avoid overexposure to the sun and ultraviolet light lamps. Use a strong sun block cream when in the sun. All of these things are important in lowering the risk of skin cancer. MENOPAUSE Menopause Symptoms: Hormone therapy products are effective for treating symptoms associated with menopause:  Moderate to severe hot flashes.  Night sweats.  Mood swings.  Headaches.  Tiredness.  Loss of sex drive.  Insomnia.  Other symptoms. Hormone replacement carries certain risks, especially in older women. Women who use or are thinking about using estrogen or estrogen with progestin treatments should discuss that with their caregiver. Your caregiver will help you understand the benefits and risks. The ideal dose of hormone replacement therapy is not known. The Food and Drug Administration (FDA) has concluded that hormone therapy should be used only at the lowest doses and for the shortest amount of time to reach treatment goals.  OSTEOPOROSIS Protecting Against Bone Loss and Preventing Fracture: If you use hormone therapy for prevention of bone loss (osteoporosis), the risks for bone loss must outweigh the risk of the therapy. Ask your caregiver about other medications known to be safe and effective for preventing bone loss and fractures. To guard against bone loss or fractures, the following is recommended:  If  you are less than age 50, take 1000 mg of calcium and at least 600 mg of Vitamin D per day.  If you are greater than age 50 but less than age 70, take 1200 mg of calcium and at least 600 mg of Vitamin D per day.  If you are greater than age 70, take 1200 mg of calcium and at least 800 mg of Vitamin D per day. Smoking and excessive alcohol intake increases the risk of osteoporosis. Eat foods rich in calcium and vitamin D and do weight bearing exercises several times a week as your caregiver suggests. DIABETES Diabetes Melitus: If you have Type I or Type 2 diabetes, you should keep your blood sugar under control with diet, exercise and recommended medication. Avoid too many sweets, starchy and fatty foods. Being overweight can make control more difficult. COGNITION AND MEMORY Cognition and Memory: Menopausal hormone therapy is not recommended for the prevention of cognitive disorders such as Alzheimer's disease or memory loss.  DEPRESSION  Depression may occur at any age, but is common in elderly women. The reasons may be because of physical, medical, social (loneliness), or financial problems and needs. If you are experiencing depression because of medical problems and control of symptoms, talk to your caregiver about this. Physical activity and   exercise may help with mood and sleep. Community and volunteer involvement may help your sense of value and worth. If you have depression and you feel that the problem is getting worse or becoming severe, talk to your caregiver about treatment options that are best for you. ACCIDENTS  Accidents are common and can be serious in the elderly woman. Prepare your house to prevent accidents. Eliminate throw rugs, place hand bars in the bath, shower and toilet areas. Avoid wearing high heeled shoes or walking on wet, snowy, and icy areas. Limit or stop driving if you have vision or hearing problems, or you feel you are unsteady with you movements and  reflexes. HEPATITIS C Hepatitis C is a type of viral infection affecting the liver. It is spread mainly through contact with blood from an infected person. It can be treated, but if left untreated, it can lead to severe liver damage over years. Many people who are infected do not know that the virus is in their blood. If you are a "baby-boomer", it is recommended that you have one screening test for Hepatitis C. IMMUNIZATIONS  Several immunizations are important to consider having during your senior years, including:   Tetanus, diptheria, and pertussis booster shot.  Influenza every year before the flu season begins.  Pneumonia vaccine.  Shingles vaccine.  Others as indicated based on your specific needs. Talk to your caregiver about these. Document Released: 07/27/2005 Document Revised: 05/21/2012 Document Reviewed: 03/22/2008 Exit[employer] Patient Information 2014 [employer].  

## 2013-11-04 NOTE — Progress Notes (Signed)
Michelle Huynh [birth year] [id number]    History:    Presents for annual exam.  Postmenopausal/no HRT/no bleeding. 53 her option ablation. Normal Pap and mammogram history. Colitis, normal colonoscopy 2014. Elevated [place] 2014.  Past medical history, past surgical history, family history and social history were all reviewed and documented in the EPIC chart. Married last month honeymooned in [place]. Works Insurance underwriter. Parents hypertension, mother diabetes.  ROS:  A  12 point ROS was performed and pertinent positives and negatives are included.  Exam:  Filed Vitals:   11/04/13 0847  BP: 120/78    General appearance:  Normal Thyroid:  Symmetrical, normal in size, without palpable masses or nodularity. Respiratory  Auscultation:  Clear without wheezing or rhonchi Cardiovascular  Auscultation:  Regular rate, without rubs, murmurs or gallops  Edema/varicosities:  Not grossly evident Abdominal  Soft,nontender, without masses, guarding or rebound.  Liver/spleen:  No organomegaly noted  Hernia:  None appreciated  Skin  Inspection:  Grossly normal   Breasts: Examined lying and sitting.     Right: Without masses, retractions, discharge or axillary adenopathy.     Left: Without masses, retractions, discharge or axillary adenopathy. Gentitourinary   Inguinal/mons:  Normal without inguinal adenopathy  External genitalia:  Normal  BUS/Urethra/Skene's glands:  Normal  Vagina:  Normal  Cervix:  Normal  Uterus:   normal in size, shape and contour.  Midline and mobile  Adnexa/parametria:     Rt: Without masses or tenderness.   Lt: Without masses or tenderness.  Anus and perineum: Normal  Digital rectal exam: Normal sphincter tone without palpated masses or tenderness  Assessment/Plan:  53 y.o. MWF G0 for annual exam with no complaints.  Normal post menopausal exam/no HRT/no bleeding Colitis Obesity  Plan: Discussion about weight, gained 13 pounds in the past year, reviewed importance of  increasing exercise, decreasing calories, Weight Watchers reviewed. SBE's,  annual mammogram, overdue instructed to schedule mammogram, calcium rich diet, vitamin D 2000 daily encouraged. DEXA, will schedule. CBC, comprehensive metabolic panel, lipid panel, TSH, UA, Pap normal 2014, new screening guidelines reviewed.   Note: This dictation was prepared with Dragon/digital dictation.  Any transcriptional errors that result are unintentional. Huel Cote [facility], 9:32 AM 11/04/2013

## 2013-11-05 ENCOUNTER — Encounter: Payer: Self-pay | Admitting: Women's Health

## 2013-11-05 LAB — URINALYSIS W MICROSCOPIC + REFLEX CULTURE
Bilirubin Urine: NEGATIVE
Casts: NONE SEEN
Crystals: NONE SEEN
Glucose, UA: NEGATIVE mg/dL
HGB URINE DIPSTICK: NEGATIVE
Ketones, ur: NEGATIVE mg/dL
Leukocytes, UA: NEGATIVE
NITRITE: NEGATIVE
PROTEIN: NEGATIVE mg/dL
Specific Gravity, Urine: 1.024 (ref 1.005–1.030)
UROBILINOGEN UA: 1 mg/dL (ref 0.0–1.0)
pH: 5 (ref 5.0–8.0)

## 2013-11-23 ENCOUNTER — Other Ambulatory Visit: Payer: Self-pay | Admitting: Obstetrics and Gynecology

## 2013-12-23 ENCOUNTER — Encounter: Payer: Self-pay | Admitting: Women's Health

## 2014-08-09 ENCOUNTER — Other Ambulatory Visit: Payer: Self-pay

## 2014-08-09 DIAGNOSIS — F329 Major depressive disorder, single episode, unspecified: Secondary | ICD-10-CM

## 2014-08-09 DIAGNOSIS — F32A Depression, unspecified: Secondary | ICD-10-CM

## 2014-08-09 MED ORDER — FLUOXETINE HCL 20 MG PO CAPS
20.0000 mg | ORAL_CAPSULE | Freq: Every day | ORAL | Status: DC
Start: 2014-08-09 — End: 2014-11-10

## 2014-08-11 ENCOUNTER — Other Ambulatory Visit: Payer: Self-pay

## 2014-09-22 ENCOUNTER — Other Ambulatory Visit: Payer: Self-pay | Admitting: Women's Health

## 2014-09-22 DIAGNOSIS — Z780 Asymptomatic menopausal state: Secondary | ICD-10-CM

## 2014-09-27 ENCOUNTER — Other Ambulatory Visit: Payer: Self-pay | Admitting: Gynecology

## 2014-09-27 DIAGNOSIS — Z780 Asymptomatic menopausal state: Secondary | ICD-10-CM

## 2014-10-05 ENCOUNTER — Other Ambulatory Visit: Payer: Self-pay | Admitting: Gynecology

## 2014-10-05 ENCOUNTER — Ambulatory Visit (INDEPENDENT_AMBULATORY_CARE_PROVIDER_SITE_OTHER): Payer: 59

## 2014-10-05 DIAGNOSIS — Z13820 Encounter for screening for osteoporosis: Secondary | ICD-10-CM

## 2014-10-05 DIAGNOSIS — Z780 Asymptomatic menopausal state: Secondary | ICD-10-CM

## 2014-10-18 DIAGNOSIS — Z86010 Personal history of colonic polyps: Secondary | ICD-10-CM | POA: Insufficient documentation

## 2014-10-18 DIAGNOSIS — Z860101 Personal history of adenomatous and serrated colon polyps: Secondary | ICD-10-CM | POA: Insufficient documentation

## 2014-11-10 ENCOUNTER — Ambulatory Visit (INDEPENDENT_AMBULATORY_CARE_PROVIDER_SITE_OTHER): Payer: 59 | Admitting: Women's Health

## 2014-11-10 ENCOUNTER — Encounter: Payer: Self-pay | Admitting: Women's Health

## 2014-11-10 ENCOUNTER — Other Ambulatory Visit (HOSPITAL_COMMUNITY)
Admission: RE | Admit: 2014-11-10 | Discharge: 2014-11-10 | Disposition: A | Discharge status: Home / Self Care | Payer: 59 | Source: Ambulatory Visit | Attending: Women's Health | Admitting: Women's Health

## 2014-11-10 VITALS — BP 124/78 | Ht 63.0 in | Wt 192.0 lb

## 2014-11-10 DIAGNOSIS — F329 Major depressive disorder, single episode, unspecified: Secondary | ICD-10-CM

## 2014-11-10 DIAGNOSIS — B009 Herpesviral infection, unspecified: Secondary | ICD-10-CM | POA: Diagnosis not present

## 2014-11-10 DIAGNOSIS — Z01419 Encounter for gynecological examination (general) (routine) without abnormal findings: Secondary | ICD-10-CM

## 2014-11-10 DIAGNOSIS — F32A Depression, unspecified: Secondary | ICD-10-CM

## 2014-11-10 DIAGNOSIS — Z1151 Encounter for screening for human papillomavirus (HPV): Secondary | ICD-10-CM | POA: Diagnosis present

## 2014-11-10 MED ORDER — FLUOXETINE HCL 20 MG PO CAPS: 20.0000 mg | ORAL_CAPSULE | Freq: Every day | ORAL | Status: DC

## 2014-11-10 NOTE — Progress Notes (Signed)
ROGENE METH 1960-11-23 [id number]    History:    Presents for annual exam.  Postmenopausal/no bleeding/no HRT. Endometrial ablation 2009. Normal Pap and mammogram history. 09/2014 normal DEXA spine T score -0.5. 2014 negative colonoscopy, history of colitis.  Past medical history, past surgical history, family history and social history were all reviewed and documented in the EPIC chart. Works at [employer]-. Married 2015. Parents hypertension, mother diabetes.  ROS:  A ROS was performed and pertinent positives and negatives are included.  Exam:  Filed Vitals:   11/10/14 1401  BP: 124/78    General appearance:  Normal Thyroid:  Symmetrical, normal in size, without palpable masses or nodularity. Respiratory  Auscultation:  Clear without wheezing or rhonchi Cardiovascular  Auscultation:  Regular rate, without rubs, murmurs or gallops  Edema/varicosities:  Not grossly evident Abdominal  Soft,nontender, without masses, guarding or rebound.  Liver/spleen:  No organomegaly noted  Hernia:  None appreciated  Skin  Inspection:  Grossly normal   Breasts: Examined lying and sitting.     Right: Without masses, retractions, discharge or axillary adenopathy.     Left: Without masses, retractions, discharge or axillary adenopathy. Gentitourinary   Inguinal/mons:  Normal without inguinal adenopathy  External genitalia:  Normal  BUS/Urethra/Skene's glands:  Normal  Vagina:  Normal  Cervix:  Normal stenotic  Uterus:  normal in size, shape and contour.  Midline and mobile  Adnexa/parametria:     Rt: Without masses or tenderness.   Lt: Without masses or tenderness.  Anus and perineum: Normal  Digital rectal exam: Normal sphincter tone without palpated masses or tenderness  Assessment/Plan:  54 y.o. MWF G0 for annual exam with complaint of right hand pain some joint area  Postmenopausal/no HRT/no bleeding Colitis GI manages [facility] primary care  Plan: SBE's, continue annual  screening mammogram, calcium rich diet, vitamin D 2000 daily. Valtrex 500 twice daily when necessary for episodic use. Denies need for prescription. UA, Pap with HR HPV typing, new screening guidelines reviewed. Instructed to call Dr. Daylene Katayama at [facility] for  hand pain evaluation.  Huel Cote WHNP, 5:50 PM 11/10/2014

## 2014-11-10 NOTE — Patient Instructions (Signed)
Health Recommendations for Postmenopausal Women Respected and ongoing research has looked at the most common causes of death, disability, and poor quality of life in postmenopausal women. The causes include heart disease, diseases of blood vessels, diabetes, depression, cancer, and bone loss (osteoporosis). Many things can be done to help lower the chances of developing these and other common problems. CARDIOVASCULAR DISEASE Heart Disease: A heart attack is a medical emergency. Know the signs and symptoms of a heart attack. Below are things women can do to reduce their risk for heart disease.   Do not smoke. If you smoke, quit.  Aim for a healthy weight. Being overweight causes many preventable deaths. Eat a healthy and balanced diet and drink an adequate amount of liquids.  Get moving. Make a commitment to be more physically active. Aim for 30 minutes of activity on most, if not all days of the week.  Eat for heart health. Choose a diet that is low in saturated fat and cholesterol and eliminate trans fat. Include whole grains, vegetables, and fruits. Read and understand the labels on food containers before buying.  Know your numbers. Ask your caregiver to check your blood pressure, cholesterol (total, HDL, LDL, triglycerides) and blood glucose. Work with your caregiver on improving your entire clinical picture.  High blood pressure. Limit or stop your table salt intake (try salt substitute and food seasonings). Avoid salty foods and drinks. Read labels on food containers before buying. Eating well and exercising can help control high blood pressure. STROKE  Stroke is a medical emergency. Stroke may be the result of a blood clot in a blood vessel in the brain or by a brain hemorrhage (bleeding). Know the signs and symptoms of a stroke. To lower the risk of developing a stroke:  Avoid fatty foods.  Quit smoking.  Control your diabetes, blood pressure, and irregular heart rate. THROMBOPHLEBITIS  (BLOOD CLOT) OF THE LEG  Becoming overweight and leading a stationary lifestyle may also contribute to developing blood clots. Controlling your diet and exercising will help lower the risk of developing blood clots. CANCER SCREENING  Breast Cancer: Take steps to reduce your risk of breast cancer.  You should practice "breast self-awareness." This means understanding the normal appearance and feel of your breasts and should include breast self-examination. Any changes detected, no matter how small, should be reported to your caregiver.  After age 4, you should have a clinical breast exam (CBE) every year.  Starting at age 48, you should consider having a mammogram (breast X-ray) every year.  If you have a family history of breast cancer, talk to your caregiver about genetic screening.  If you are at high risk for breast cancer, talk to your caregiver about having an MRI and a mammogram every year.  Intestinal or Stomach Cancer: Tests to consider are a rectal exam, fecal occult blood, sigmoidoscopy, and colonoscopy. Women who are high risk may need to be screened at an earlier age and more often.  Cervical Cancer:  Beginning at age 72, you should have a Pap test every 3 years as long as the past 3 Pap tests have been normal.  If you have had past treatment for cervical cancer or a condition that could lead to cancer, you need Pap tests and screening for cancer for at least 20 years after your treatment.  If you had a hysterectomy for a problem that was not cancer or a condition that could lead to cancer, then you no longer need Pap tests.  If you are between ages 65 and 70, and you have had normal Pap tests going back 10 years, you no longer need Pap tests.  If Pap tests have been discontinued, risk factors (such as a new sexual partner) need to be reassessed to determine if screening should be resumed.  Some medical problems can increase the chance of getting cervical cancer. In these  cases, your caregiver may recommend more frequent screening and Pap tests.  Uterine Cancer: If you have vaginal bleeding after reaching menopause, you should notify your caregiver.  Ovarian Cancer: Other than yearly pelvic exams, there are no reliable tests available to screen for ovarian cancer at this time except for yearly pelvic exams.  Lung Cancer: Yearly chest X-rays can detect lung cancer and should be done on high risk women, such as cigarette smokers and women with chronic lung disease (emphysema).  Skin Cancer: A complete body skin exam should be done at your yearly examination. Avoid overexposure to the sun and ultraviolet light lamps. Use a strong sun block cream when in the sun. All of these things are important for lowering the risk of skin cancer. MENOPAUSE Menopause Symptoms: Hormone therapy products are effective for treating symptoms associated with menopause:  Moderate to severe hot flashes.  Night sweats.  Mood swings.  Headaches.  Tiredness.  Loss of sex drive.  Insomnia.  Other symptoms. Hormone replacement carries certain risks, especially in older women. Women who use or are thinking about using estrogen or estrogen with progestin treatments should discuss that with their caregiver. Your caregiver will help you understand the benefits and risks. The ideal dose of hormone replacement therapy is not known. The Food and Drug Administration (FDA) has concluded that hormone therapy should be used only at the lowest doses and for the shortest amount of time to reach treatment goals.  OSTEOPOROSIS Protecting Against Bone Loss and Preventing Fracture If you use hormone therapy for prevention of bone loss (osteoporosis), the risks for bone loss must outweigh the risk of the therapy. Ask your caregiver about other medications known to be safe and effective for preventing bone loss and fractures. To guard against bone loss or fractures, the following is recommended:  If  you are younger than age 50, take 1000 mg of calcium and at least 600 mg of Vitamin D per day.  If you are older than age 50 but younger than age 70, take 1200 mg of calcium and at least 600 mg of Vitamin D per day.  If you are older than age 70, take 1200 mg of calcium and at least 800 mg of Vitamin D per day. Smoking and excessive alcohol intake increases the risk of osteoporosis. Eat foods rich in calcium and vitamin D and do weight bearing exercises several times a week as your caregiver suggests. DIABETES Diabetes Mellitus: If you have type I or type 2 diabetes, you should keep your blood sugar under control with diet, exercise, and recommended medication. Avoid starchy and fatty foods, and too many sweets. Being overweight can make diabetes control more difficult. COGNITION AND MEMORY Cognition and Memory: Menopausal hormone therapy is not recommended for the prevention of cognitive disorders such as Alzheimer's disease or memory loss.  DEPRESSION  Depression may occur at any age, but it is common in elderly women. This may be because of physical, medical, social (loneliness), or financial problems and needs. If you are experiencing depression because of medical problems and control of symptoms, talk to your caregiver about this. Physical   activity and exercise may help with mood and sleep. Community and volunteer involvement may improve your sense of value and worth. If you have depression and you feel that the problem is getting worse or becoming severe, talk to your caregiver about which treatment options are best for you. ACCIDENTS  Accidents are common and can be serious in elderly woman. Prepare your house to prevent accidents. Eliminate throw rugs, place hand bars in bath, shower, and toilet areas. Avoid wearing high heeled shoes or walking on wet, snowy, and icy areas. Limit or stop driving if you have vision or hearing problems, or if you feel you are unsteady with your movements and  reflexes. HEPATITIS C Hepatitis C is a type of viral infection affecting the liver. It is spread mainly through contact with blood from an infected person. It can be treated, but if left untreated, it can lead to severe liver damage over the years. Many people who are infected do not know that the virus is in their blood. If you are a "baby-boomer", it is recommended that you have one screening test for Hepatitis C. IMMUNIZATIONS  Several immunizations are important to consider having during your senior years, including:   Tetanus, diphtheria, and pertussis booster shot.  Influenza every year before the flu season begins.  Pneumonia vaccine.  Shingles vaccine.  Others, as indicated based on your specific needs. Talk to your caregiver about these. Document Released: 07/27/2005 Document Revised: 10/19/2013 Document Reviewed: 03/22/2008 Exit[employer] Patient Information 2015 [employer]. This information is not intended to replace advice given to you by your health care provider. Make sure you discuss any questions you have with your health care provider. Exercise to Stay Healthy Exercise helps you become and stay healthy. EXERCISE IDEAS AND TIPS Choose exercises that:  You enjoy.  Fit into your day. You do not need to exercise really hard to be healthy. You can do exercises at a slow or medium level and stay healthy. You can:  Stretch before and after working out.  Try yoga, Pilates, or tai chi.  Lift weights.  Walk fast, swim, jog, run, climb stairs, bicycle, dance, or rollerskate.  Take aerobic classes. Exercises that burn about 150 calories:  Running 1  miles in 15 minutes.  Playing volleyball for 45 to 60 minutes.  Washing and waxing a car for 45 to 60 minutes.  Playing touch football for 45 minutes.  Walking 1  miles in 35 minutes.  Pushing a stroller 1  miles in 30 minutes.  Playing basketball for 30 minutes.  Raking leaves for 30 minutes.  Bicycling 5  miles in 30 minutes.  Walking 2 miles in 30 minutes.  Dancing for 30 minutes.  Shoveling snow for 15 minutes.  Swimming laps for 20 minutes.  Walking up stairs for 15 minutes.  Bicycling 4 miles in 15 minutes.  Gardening for 30 to 45 minutes.  Jumping rope for 15 minutes.  Washing windows or floors for 45 to 60 minutes. Document Released: 07/07/2010 Document Revised: 08/27/2011 Document Reviewed: 07/07/2010 Exit[employer] Patient Information 2015 [employer]. This information is not intended to replace advice given to you by your health care provider. Make sure you discuss any questions you have with your health care provider.  

## 2014-11-11 LAB — URINALYSIS W MICROSCOPIC + REFLEX CULTURE
BILIRUBIN URINE: NEGATIVE
Bacteria, UA: NONE SEEN
Casts: NONE SEEN
Glucose, UA: NEGATIVE mg/dL
Hgb urine dipstick: NEGATIVE
KETONES UR: NEGATIVE mg/dL
Leukocytes, UA: NEGATIVE
NITRITE: NEGATIVE
Protein, ur: NEGATIVE mg/dL
Specific Gravity, Urine: 1.022 (ref 1.005–1.030)
Urobilinogen, UA: 1 mg/dL (ref 0.0–1.0)
pH: 5.5 (ref 5.0–8.0)

## 2014-11-12 LAB — CYTOLOGY - PAP

## 2014-12-28 ENCOUNTER — Encounter: Payer: Self-pay | Admitting: Women's Health

## 2014-12-30 ENCOUNTER — Encounter: Payer: Self-pay | Admitting: Women's Health

## 2015-03-24 ENCOUNTER — Other Ambulatory Visit: Payer: Self-pay

## 2015-03-24 DIAGNOSIS — F329 Major depressive disorder, single episode, unspecified: Secondary | ICD-10-CM

## 2015-03-24 DIAGNOSIS — F32A Depression, unspecified: Secondary | ICD-10-CM

## 2015-03-24 MED ORDER — FLUOXETINE HCL 20 MG PO CAPS: 20.0000 mg | ORAL_CAPSULE | Freq: Every day | ORAL | Status: DC

## 2015-09-20 DIAGNOSIS — K6289 Other specified diseases of anus and rectum: Secondary | ICD-10-CM | POA: Insufficient documentation

## 2015-09-20 DIAGNOSIS — K529 Noninfective gastroenteritis and colitis, unspecified: Secondary | ICD-10-CM | POA: Insufficient documentation

## 2015-11-09 ENCOUNTER — Encounter: Payer: Self-pay | Admitting: Women's Health

## 2015-12-15 ENCOUNTER — Encounter: Payer: Self-pay | Admitting: Women's Health

## 2015-12-15 ENCOUNTER — Other Ambulatory Visit: Payer: Self-pay | Admitting: Women's Health

## 2015-12-15 ENCOUNTER — Ambulatory Visit (INDEPENDENT_AMBULATORY_CARE_PROVIDER_SITE_OTHER): Payer: 59 | Admitting: Women's Health

## 2015-12-15 VITALS — BP 124/82 | Ht 63.0 in | Wt 200.0 lb

## 2015-12-15 DIAGNOSIS — Z79890 Hormone replacement therapy (postmenopausal): Secondary | ICD-10-CM

## 2015-12-15 DIAGNOSIS — Z01419 Encounter for gynecological examination (general) (routine) without abnormal findings: Secondary | ICD-10-CM | POA: Diagnosis not present

## 2015-12-15 DIAGNOSIS — F329 Major depressive disorder, single episode, unspecified: Secondary | ICD-10-CM

## 2015-12-15 DIAGNOSIS — F32A Depression, unspecified: Secondary | ICD-10-CM

## 2015-12-15 LAB — URINALYSIS W MICROSCOPIC + REFLEX CULTURE
BACTERIA UA: NONE SEEN [HPF]
BILIRUBIN URINE: NEGATIVE
CASTS: NONE SEEN [LPF]
CRYSTALS: NONE SEEN [HPF]
Glucose, UA: NEGATIVE
Hgb urine dipstick: NEGATIVE
Ketones, ur: NEGATIVE
Leukocytes, UA: NEGATIVE
Nitrite: NEGATIVE
PROTEIN: NEGATIVE
RBC / HPF: NONE SEEN RBC/HPF (ref <=2)
Specific Gravity, Urine: 1.016 (ref 1.001–1.035)
WBC UA: NONE SEEN WBC/HPF (ref <=5)
Yeast: NONE SEEN [HPF]
pH: 6.5 (ref 5.0–8.0)

## 2015-12-15 MED ORDER — ESTRADIOL 0.5 MG PO TABS
90.0000 mg | ORAL_TABLET | Freq: Every day | ORAL | Status: DC
Start: 2015-12-15 — End: 2015-12-15

## 2015-12-15 MED ORDER — FLUOXETINE HCL 20 MG PO CAPS: 20.0000 mg | ORAL_CAPSULE | Freq: Every day | ORAL | Status: DC

## 2015-12-15 MED ORDER — ESTRADIOL 0.5 MG PO TABS: 0.5000 mg | ORAL_TABLET | Freq: Every day | ORAL | Status: DC

## 2015-12-15 MED ORDER — PROGESTERONE MICRONIZED 200 MG PO CAPS: 200.0000 mg | ORAL_CAPSULE | Freq: Every day | ORAL | Status: DC

## 2015-12-15 MED ORDER — MEDROXYPROGESTERONE ACETATE 10 MG PO TABS: 10.0000 mg | ORAL_TABLET | Freq: Every day | ORAL | Status: DC

## 2015-12-15 MED ORDER — MEDROXYPROGESTERONE ACETATE 10 MG PO TABS: ORAL_TABLET | ORAL | Status: DC

## 2015-12-15 MED ORDER — ESTRADIOL 0.05 MG/24HR TD PTTW: 1.0000 | MEDICATED_PATCH | TRANSDERMAL | Status: DC

## 2015-12-15 NOTE — Patient Instructions (Signed)
Hormone Therapy At menopause, your body begins making less estrogen and progesterone hormones. This causes the body to stop having menstrual periods. This is because estrogen and progesterone hormones control your periods and menstrual cycle. A lack of estrogen may cause symptoms such as:  Hot flushes (or hot flashes).  Vaginal dryness.  Dry skin.  Loss of sex drive.  Risk of bone loss (osteoporosis). When this happens, you may choose to take hormone therapy to get back the estrogen lost during menopause. When the hormone estrogen is given alone, it is usually referred to as ET (Estrogen Therapy). When the hormone progestin is combined with estrogen, it is generally called HT (Hormone Therapy). This was formerly known as hormone replacement therapy (HRT). Your caregiver can help you make a decision on what will be best for you. The decision to use HT seems to change often as new studies are done. Many studies do not agree on the benefits of hormone replacement therapy. LIKELY BENEFITS OF HT INCLUDE PROTECTION FROM:  Hot Flushes (also called hot flashes) - A hot flush is a sudden feeling of heat that spreads over the face and body. The skin may redden like a blush. It is connected with sweats and sleep disturbance. Women going through menopause may have hot flushes a few times a month or several times per day depending on the woman.  Osteoporosis (bone loss) - Estrogen helps guard against bone loss. After menopause, a woman's bones slowly lose calcium and become weak and brittle. As a result, bones are more likely to break. The hip, wrist, and spine are affected most often. Hormone therapy can help slow bone loss after menopause. Weight bearing exercise and taking calcium with vitamin D also can help prevent bone loss. There are also medications that your caregiver can prescribe that can help prevent osteoporosis.  Vaginal dryness - Loss of estrogen causes changes in the vagina. Its lining may  become thin and dry. These changes can cause pain and bleeding during sexual intercourse. Dryness can also lead to infections. This can cause burning and itching. (Vaginal estrogen treatment can help relieve pain, itching, and dryness.)  Urinary tract infections are more common after menopause because of lack of estrogen. Some women also develop urinary incontinence because of low estrogen levels in the vagina and bladder.  Possible other benefits of estrogen include a positive effect on mood and short-term memory in women. RISKS AND COMPLICATIONS  Using estrogen alone without progesterone causes the lining of the uterus to grow. This increases the risk of lining of the uterus (endometrial) cancer. Your caregiver should give another hormone called progestin if you have a uterus.  Women who take combined (estrogen and progestin) HT appear to have an increased risk of breast cancer. The risk appears to be small, but increases throughout the time that HT is taken.  Combined therapy also makes the breast tissue slightly denser which makes it harder to read mammograms (breast X-rays).  Combined, estrogen and progesterone therapy can be taken together every day, in which case there may be spotting of blood. HT therapy can be taken cyclically in which case you will have menstrual periods. Cyclically means HT is taken for a set amount of days, then not taken, then this process is repeated.  HT may increase the risk of stroke, heart attack, breast cancer and forming blood clots in your leg.  Transdermal estrogen (estrogen that is absorbed through the skin with a patch or a cream) may have better results with:  Cholesterol.  Blood pressure.  Blood clots. Having the following conditions may indicate you should not have HT:  Endometrial cancer.  Liver disease.  Breast cancer.  Heart disease.  History of blood clots.  Stroke. TREATMENT   If you choose to take HT and have a uterus, usually  estrogen and progestin are prescribed.  Your caregiver will help you decide the best way to take the medications.  Possible ways to take estrogen include:  Pills.  Patches.  Gels.  Sprays.  Vaginal estrogen cream, rings and tablets.  It is best to take the lowest dose possible that will help your symptoms and take them for the shortest period of time that you can.  Hormone therapy can help relieve some of the problems (symptoms) that affect women at menopause. Before making a decision about HT, talk to your caregiver about what is best for you. Be well informed and comfortable with your decisions. HOME CARE INSTRUCTIONS   Follow your caregivers advice when taking the medications.  A Pap test is done to screen for cervical cancer.  The first Pap test should be done at age 34.  Between ages 80 and 52, Pap tests are repeated every 2 years.  Beginning at age 13, you are advised to have a Pap test every 3 years as long as the past 3 Pap tests have been normal.  Some women have medical problems that increase the chance of getting cervical cancer. Talk to your caregiver about these problems. It is especially important to talk to your caregiver if a new problem develops soon after your last Pap test. In these cases, your caregiver may recommend more frequent screening and Pap tests.  The above recommendations are the same for women who have or have not gotten the vaccine for HPV (human papillomavirus).  If you had a hysterectomy for a problem that was not a cancer or a condition that could lead to cancer, then you no longer need Pap tests. However, even if you no longer need a Pap test, a regular exam is a good idea to make sure no other problems are starting.  If you are between ages 20 and 60, and you have had normal Pap tests going back 10 years, you no longer need Pap tests. However, even if you no longer need a Pap test, a regular exam is a good idea to make sure no other problems  are starting.  If you have had past treatment for cervical cancer or a condition that could lead to cancer, you need Pap tests and screening for cancer for at least 20 years after your treatment.  If Pap tests have been discontinued, risk factors (such as a new sexual partner)need to be re-assessed to determine if screening should be resumed.  Some women may need screenings more often if they are at high risk for cervical cancer.  Get mammograms done as per the advice of your caregiver. SEEK IMMEDIATE MEDICAL CARE IF:  You develop abnormal vaginal bleeding.  You have pain or swelling in your legs, shortness of breath, or chest pain.  You develop dizziness or headaches.  You have lumps or changes in your breasts or armpits.  You have slurred speech.  You develop weakness or numbness of your arms or legs.  You have pain, burning, or bleeding when urinating.  You develop abdominal pain.   This information is not intended to replace advice given to you by your health care provider. Make sure you discuss any questions  you have with your health care provider.   Document Released: 03/03/2003 Document Revised: 10/19/2014 Document Reviewed: 12/06/2014 Elsevier Interactive Patient Education [employer]. Menopause is a normal process in which your reproductive ability comes to an end. This process happens gradually over a span of months to years, usually between the ages of 64 and 61. Menopause is complete when you have missed 12 consecutive menstrual periods. It is important to talk with your health care provider about some of the most common conditions that affect postmenopausal women, such as heart disease, cancer, and bone loss (osteoporosis). Adopting a healthy lifestyle and getting preventive care can help to promote your health and wellness. Those actions can also lower your chances of developing some of these common conditions. WHAT SHOULD I KNOW ABOUT MENOPAUSE? During  menopause, you may experience a number of symptoms, such as:  Moderate-to-severe hot flashes.  Night sweats.  Decrease in sex drive.  Mood swings.  Headaches.  Tiredness.  Irritability.  Memory problems.  Insomnia. Choosing to treat or not to treat menopausal changes is an individual decision that you make with your health care provider. WHAT SHOULD I KNOW ABOUT HORMONE REPLACEMENT THERAPY AND SUPPLEMENTS? Hormone therapy products are effective for treating symptoms that are associated with menopause, such as hot flashes and night sweats. Hormone replacement carries certain risks, especially as you become older. If you are thinking about using estrogen or estrogen with progestin treatments, discuss the benefits and risks with your health care provider. WHAT SHOULD I KNOW ABOUT HEART DISEASE AND STROKE? Heart disease, heart attack, and stroke become more likely as you age. This may be due, in part, to the hormonal changes that your body experiences during menopause. These can affect how your body processes dietary fats, triglycerides, and cholesterol. Heart attack and stroke are both medical emergencies. There are many things that you can do to help prevent heart disease and stroke:  Have your blood pressure checked at least every 1-2 years. High blood pressure causes heart disease and increases the risk of stroke.  If you are 61-65 years old, ask your health care provider if you should take aspirin to prevent a heart attack or a stroke.  Do not use any tobacco products, including cigarettes, chewing tobacco, or electronic cigarettes. If you need help quitting, ask your health care provider.  It is important to eat a healthy diet and maintain a healthy weight.  Be sure to include plenty of vegetables, fruits, low-fat dairy products, and lean protein.  Avoid eating foods that are high in solid fats, added sugars, or salt (sodium).  Get regular exercise. This is one of the most  important things that you can do for your health.  Try to exercise for at least 150 minutes each week. The type of exercise that you do should increase your heart rate and make you sweat. This is known as moderate-intensity exercise.  Try to do strengthening exercises at least twice each week. Do these in addition to the moderate-intensity exercise.  Know your numbers.Ask your health care provider to check your cholesterol and your blood glucose. Continue to have your blood tested as directed by your health care provider. WHAT SHOULD I KNOW ABOUT CANCER SCREENING? There are several types of cancer. Take the following steps to reduce your risk and to catch any cancer development as early as possible. Breast Cancer  Practice breast self-awareness.  This means understanding how your breasts normally appear and feel.  It also means doing regular breast  self-exams. Let your health care provider know about any changes, no matter how small.  If you are 29 or older, have a clinician do a breast exam (clinical breast exam or CBE) every year. Depending on your age, family history, and medical history, it may be recommended that you also have a yearly breast X-ray (mammogram).  If you have a family history of breast cancer, talk with your health care provider about genetic screening.  If you are at high risk for breast cancer, talk with your health care provider about having an MRI and a mammogram every year.  Breast cancer (BRCA) gene test is recommended for women who have family members with BRCA-related cancers. Results of the assessment will determine the need for genetic counseling and BRCA1 and for BRCA2 testing. BRCA-related cancers include these types:  Breast. This occurs in males or females.  Ovarian.  Tubal. This may also be called fallopian tube cancer.  Cancer of the abdominal or pelvic lining (peritoneal cancer).  Prostate.  Pancreatic. Cervical, Uterine, and Ovarian  Cancer Your health care provider may recommend that you be screened regularly for cancer of the pelvic organs. These include your ovaries, uterus, and vagina. This screening involves a pelvic exam, which includes checking for microscopic changes to the surface of your cervix (Pap test).  For women ages 21-65, health care providers may recommend a pelvic exam and a Pap test every three years. For women ages 59-65, they may recommend the Pap test and pelvic exam, combined with testing for human papilloma virus (HPV), every five years. Some types of HPV increase your risk of cervical cancer. Testing for HPV may also be done on women of any age who have unclear Pap test results.  Other health care providers may not recommend any screening for nonpregnant women who are considered low risk for pelvic cancer and have no symptoms. Ask your health care provider if a screening pelvic exam is right for you.  If you have had past treatment for cervical cancer or a condition that could lead to cancer, you need Pap tests and screening for cancer for at least 20 years after your treatment. If Pap tests have been discontinued for you, your risk factors (such as having a new sexual partner) need to be reassessed to determine if you should start having screenings again. Some women have medical problems that increase the chance of getting cervical cancer. In these cases, your health care provider may recommend that you have screening and Pap tests more often.  If you have a family history of uterine cancer or ovarian cancer, talk with your health care provider about genetic screening.  If you have vaginal bleeding after reaching menopause, tell your health care provider.  There are currently no reliable tests available to screen for ovarian cancer. Lung Cancer Lung cancer screening is recommended for adults 89-38 years old who are at high risk for lung cancer because of a history of smoking. A yearly low-dose CT scan of  the lungs is recommended if you:  Currently smoke.  Have a history of at least 30 pack-years of smoking and you currently smoke or have quit within the past 15 years. A pack-year is smoking an average of one pack of cigarettes per day for one year. Yearly screening should:  Continue until it has been 15 years since you quit.  Stop if you develop a health problem that would prevent you from having lung cancer treatment. Colorectal Cancer  This type of cancer can  be detected and can often be prevented.  Routine colorectal cancer screening usually begins at age 65 and continues through age 9.  If you have risk factors for colon cancer, your health care provider may recommend that you be screened at an earlier age.  If you have a family history of colorectal cancer, talk with your health care provider about genetic screening.  Your health care provider may also recommend using home test kits to check for hidden blood in your stool.  A small camera at the end of a tube can be used to examine your colon directly (sigmoidoscopy or colonoscopy). This is done to check for the earliest forms of colorectal cancer.  Direct examination of the colon should be repeated every 5-10 years until age 65. However, if early forms of precancerous polyps or small growths are found or if you have a family history or genetic risk for colorectal cancer, you may need to be screened more often. Skin Cancer  Check your skin from head to toe regularly.  Monitor any moles. Be sure to tell your health care provider:  About any new moles or changes in moles, especially if there is a change in a mole's shape or color.  If you have a mole that is larger than the size of a pencil eraser.  If any of your family members has a history of skin cancer, especially at a Ether Wolters age, talk with your health care provider about genetic screening.  Always use sunscreen. Apply sunscreen liberally and repeatedly throughout the  day.  Whenever you are outside, protect yourself by wearing long sleeves, pants, a wide-brimmed hat, and sunglasses. WHAT SHOULD I KNOW ABOUT OSTEOPOROSIS? Osteoporosis is a condition in which bone destruction happens more quickly than new bone creation. After menopause, you may be at an increased risk for osteoporosis. To help prevent osteoporosis or the bone fractures that can happen because of osteoporosis, the following is recommended:  If you are 76-86 years old, get at least 1,000 mg of calcium and at least 600 mg of vitamin D per day.  If you are older than age 62 but younger than age 60, get at least 1,200 mg of calcium and at least 600 mg of vitamin D per day.  If you are older than age 106, get at least 1,200 mg of calcium and at least 800 mg of vitamin D per day. Smoking and excessive alcohol intake increase the risk of osteoporosis. Eat foods that are rich in calcium and vitamin D, and do weight-bearing exercises several times each week as directed by your health care provider. WHAT SHOULD I KNOW ABOUT HOW MENOPAUSE AFFECTS [place]? Depression may occur at any age, but it is more common as you become older. Common symptoms of depression include:  Low or sad mood.  Changes in sleep patterns.  Changes in appetite or eating patterns.  Feeling an overall lack of motivation or enjoyment of activities that you previously enjoyed.  Frequent crying spells. Talk with your health care provider if you think that you are experiencing depression. WHAT SHOULD I KNOW ABOUT IMMUNIZATIONS? It is important that you get and maintain your immunizations. These include:  Tetanus, diphtheria, and pertussis (Tdap) booster vaccine.  Influenza every year before the flu season begins.  Pneumonia vaccine.  Shingles vaccine. Your health care provider may also recommend other immunizations.   This information is not intended to replace advice given to you by your health care provider. Make  sure you discuss  any questions you have with your health care provider.   Document Released: 07/27/2005 Document Revised: 06/25/2014 Document Reviewed: 02/04/2014 Elsevier Interactive Patient Education [employer].

## 2015-12-15 NOTE — Progress Notes (Signed)
AISSATOU UPDIKE 1960/11/25 [id number]    History:    Presents for annual exam.  Has been on Prozac for depression since divorce years ago which had helped with menopausal symptoms, but has experienced increased symptoms of depression, night sweats, poor sleep, hot flashes, and decreased libido over last year.  Postmenopausal, no bleeding, no HRT.  2009 HER option endometrial ablation.  Normal Pap and mammogram history.  History of colitis with a few episodes over past year.  Normal colonoscopy 2014.  DEXA 2016 normal with T score -0.5 at spine.  Left plantar fascitis managed by Orthopedist, cortisone injection one month ago with relief.    Past medical history, past surgical history, family history and social history were all reviewed and documented in the EPIC chart.  Works at [employer].  Married 2015.  Recent cruise to [place], planning trip to [place] in August.  Mother DM, Stage 5 CKD with GFR of 12, HTN, CVD, CHF.  Father HTN.  Mother-in-law passed away 3 weeks ago, sister-in law passed away 07/14/22.  ROS:  A ROS was performed and pertinent positives and negatives are included.  Exam:  Filed Vitals:   12/15/15 0904  BP: 124/82    General appearance:  Normal, tearful Thyroid:  Symmetrical, normal in size, without palpable masses or nodularity. Respiratory  Auscultation:  Clear without wheezing or rhonchi Cardiovascular  Auscultation:  Regular rate, without rubs, murmurs or gallops  Edema/varicosities:  Not grossly evident Abdominal  Soft,nontender, without masses, guarding or rebound.  Liver/spleen:  No organomegaly noted  Hernia:  None appreciated  Skin  Inspection:  Grossly normal   Breasts: Examined lying and sitting.     Right: Without masses, retractions, discharge or axillary adenopathy.     Left: Without masses, retractions, discharge or axillary adenopathy. Gentitourinary   Inguinal/mons:  Normal without inguinal adenopathy  External genitalia:   Normal  BUS/Urethra/Skene's glands:  Normal  Vagina:  Normal  Cervix:  Normal  Uterus:  Normal in size, shape and contour.  Midline and mobile  Adnexa/parametria:     Rt: Without masses or tenderness.   Lt: Without masses or tenderness.  Anus and perineum: Normal  Digital rectal exam: Normal sphincter tone without palpated masses or tenderness  Assessment/Plan:  55 y.o. MWF G0 for annual exam with numerous menopausal symptoms.  Postmenopausal, no HRT, no bleeding, increased menopausal symptoms 2009 Endometrial ablation Obesity History of depression /Situational Stress - changes at work, loss of family members Plantar Fascitis, managed by Orthopedist Labs-primary care Colitis - GI manages  Plan: SBE's, annual mammogram, screening guidelines reviewed.  Encouraged healthy diet, decrease carbohydrates in diet, and regular exercise as tolerated with left foot pain.   Discussed orthotics and shoes with good support.  Hormone replacement therapy discussed, Prometrium first 12 days of month.  Vivelle 0.05mg  patch prescribed, reviewed application instructions and risks of blood clots, stroke, breast cancer.  Continue Prozac 20 mg by mouth daily. Reviewed importance of leisure activities and self-care. Call if symptoms do not improve.  2016 Pap normal with negative HR HPV No Pap per guidelines.   Condolences given on loss of Mother-in-law and Science writer.   Huel Cote WHNP, 10:05 AM 12/15/2015

## 2016-01-13 ENCOUNTER — Encounter: Payer: Self-pay | Admitting: Women's Health

## 2016-03-06 ENCOUNTER — Encounter: Payer: Self-pay | Admitting: Women's Health

## 2016-03-06 NOTE — Telephone Encounter (Signed)
Izora Gala, I copied the part of this message for Butch Penny and forwarded it to her already.

## 2016-08-10 ENCOUNTER — Encounter: Payer: Self-pay | Admitting: Women's Health

## 2016-10-09 ENCOUNTER — Encounter: Payer: Self-pay | Admitting: Women's Health

## 2016-12-25 ENCOUNTER — Encounter: Payer: 59 | Admitting: Women's Health

## 2016-12-31 ENCOUNTER — Encounter: Payer: Self-pay | Admitting: Women's Health

## 2017-01-02 ENCOUNTER — Encounter: Payer: Self-pay | Admitting: Women's Health

## 2017-01-02 ENCOUNTER — Ambulatory Visit (INDEPENDENT_AMBULATORY_CARE_PROVIDER_SITE_OTHER): Payer: 59 | Admitting: Women's Health

## 2017-01-02 VITALS — BP 120/80 | Ht 63.0 in | Wt 200.0 lb

## 2017-01-02 DIAGNOSIS — Z13220 Encounter for screening for lipoid disorders: Secondary | ICD-10-CM | POA: Diagnosis not present

## 2017-01-02 DIAGNOSIS — Z01419 Encounter for gynecological examination (general) (routine) without abnormal findings: Secondary | ICD-10-CM

## 2017-01-02 DIAGNOSIS — B009 Herpesviral infection, unspecified: Secondary | ICD-10-CM

## 2017-01-02 DIAGNOSIS — F3289 Other specified depressive episodes: Secondary | ICD-10-CM

## 2017-01-02 LAB — CBC WITH DIFFERENTIAL/PLATELET
BASOS PCT: 0 %
Basophils Absolute: 0 cells/uL (ref 0–200)
Eosinophils Absolute: 150 cells/uL (ref 15–500)
Eosinophils Relative: 3 %
HCT: 44.1 % (ref 35.0–45.0)
HEMOGLOBIN: 14.8 g/dL (ref 11.7–15.5)
LYMPHS ABS: 1500 {cells}/uL (ref 850–3900)
Lymphocytes Relative: 30 %
MCH: 32.7 pg (ref 27.0–33.0)
MCHC: 33.6 g/dL (ref 32.0–36.0)
MCV: 97.4 fL (ref 80.0–100.0)
MPV: 10.3 fL (ref 7.5–12.5)
Monocytes Absolute: 500 cells/uL (ref 200–950)
Monocytes Relative: 10 %
NEUTROS ABS: 2850 {cells}/uL (ref 1500–7800)
Neutrophils Relative %: 57 %
PLATELETS: 232 10*3/uL (ref 140–400)
RBC: 4.53 MIL/uL (ref 3.80–5.10)
RDW: 13.6 % (ref 11.0–15.0)
WBC: 5 10*3/uL (ref 3.8–10.8)

## 2017-01-02 LAB — COMPREHENSIVE METABOLIC PANEL
ALBUMIN: 4.1 g/dL (ref 3.6–5.1)
ALK PHOS: 69 U/L (ref 33–130)
ALT: 49 U/L — AB (ref 6–29)
AST: 40 U/L — AB (ref 10–35)
BILIRUBIN TOTAL: 0.5 mg/dL (ref 0.2–1.2)
BUN: 16 mg/dL (ref 7–25)
CO2: 26 mmol/L (ref 20–31)
CREATININE: 0.93 mg/dL (ref 0.50–1.05)
Calcium: 9 mg/dL (ref 8.6–10.4)
Chloride: 104 mmol/L (ref 98–110)
Glucose, Bld: 94 mg/dL (ref 65–99)
Potassium: 4.4 mmol/L (ref 3.5–5.3)
SODIUM: 139 mmol/L (ref 135–146)
TOTAL PROTEIN: 6.6 g/dL (ref 6.1–8.1)

## 2017-01-02 LAB — LIPID PANEL
CHOLESTEROL: 204 mg/dL — AB (ref <200)
HDL: 44 mg/dL — ABNORMAL LOW (ref >50)
LDL Cholesterol: 138 mg/dL — ABNORMAL HIGH (ref <100)
Total CHOL/HDL Ratio: 4.6 Ratio (ref <5.0)
Triglycerides: 110 mg/dL (ref <150)
VLDL: 22 mg/dL (ref <30)

## 2017-01-02 MED ORDER — FLUOXETINE HCL 20 MG PO CAPS: 20.0000 mg | ORAL_CAPSULE | Freq: Every day | ORAL | 4 refills | Status: DC

## 2017-01-02 MED ORDER — VALACYCLOVIR HCL 500 MG PO TABS: ORAL_TABLET | ORAL | 1 refills | Status: DC

## 2017-01-02 NOTE — Progress Notes (Signed)
Michelle Huynh 02/16/1961 [id number]    History:    Presents for annual exam.  Postmenopausal on no HRT with no bleeding. 2009 her option. Normal Pap and mammogram history. 2014 negative colonoscopy. 2016 normal DEXA T score -0.5 at spine. On Prozac 10 mg daily. History of colitis on Lialda.  Past medical history, past surgical history, family history and social history were all reviewed and documented in the EPIC chart. Works at [employer]. Father hypertension. Mother diabetes, hypertension and cardiovascular disease.  ROS:  A ROS was performed and pertinent positives and negatives are included.  Exam:  Vitals:   01/02/17 0801  BP: 120/80  Weight: 200 lb (90.7 kg)  Height: 5\' 3"  (1.6 m)   Body mass index is 35.43 kg/m.   General appearance:  Normal Thyroid:  Symmetrical, normal in size, without palpable masses or nodularity. Respiratory  Auscultation:  Clear without wheezing or rhonchi Cardiovascular  Auscultation:  Regular rate, without rubs, murmurs or gallops  Edema/varicosities:  Not grossly evident Abdominal  Soft,nontender, without masses, guarding or rebound.  Liver/spleen:  No organomegaly noted  Hernia:  None appreciated  Skin  Inspection:  Grossly normal   Breasts: Examined lying and sitting.     Right: Without masses, retractions, discharge or axillary adenopathy.     Left: Without masses, retractions, discharge or axillary adenopathy. Gentitourinary   Inguinal/mons:  Normal without inguinal adenopathy  External genitalia:  Normal  BUS/Urethra/Skene's glands:  Normal  Vagina:  Normal  Cervix:  Normal  Uterus:   normal in size, shape and contour.  Midline and mobile  Adnexa/parametria:     Rt: Without masses or tenderness.   Lt: Without masses or tenderness.  Anus and perineum: Normal  Digital rectal exam: Normal sphincter tone without palpated masses or tenderness  Assessment/Plan:  56 y.o. MWF G0 for annual exam with no complaints.  Postmenopausal/no  HRT/no bleeding Depression stable on Prozac Colitis-GI manages meds Obesity  Plan: SBE's, continue annual screening mammogram scheduled, calcium rich diet, vitamin D 2000 daily encouraged. Reviewed importance of increasing regular weightbearing exercise, home safety, fall prevention discussed. Prozac 10 mg by mouth daily prescription, proper use given and reviewed importance of leisure activities and exercise. CBC, CMP, lipid panel, Pap normal with negative HR HPV 2016, new screening guidelines reviewed.    Huel Cote [facility], 9:10 AM 01/02/2017

## 2017-01-02 NOTE — Patient Instructions (Signed)
Health Maintenance for Postmenopausal Women Menopause is a normal process in which your reproductive ability comes to an end. This process happens gradually over a span of months to years, usually between the ages of 22 and 9. Menopause is complete when you have missed 12 consecutive menstrual periods. It is important to talk with your health care provider about some of the most common conditions that affect postmenopausal women, such as heart disease, cancer, and bone loss (osteoporosis). Adopting a healthy lifestyle and getting preventive care can help to promote your health and wellness. Those actions can also lower your chances of developing some of these common conditions. What should I know about menopause? During menopause, you may experience a number of symptoms, such as:  Moderate-to-severe hot flashes.  Night sweats.  Decrease in sex drive.  Mood swings.  Headaches.  Tiredness.  Irritability.  Memory problems.  Insomnia.  Choosing to treat or not to treat menopausal changes is an individual decision that you make with your health care provider. What should I know about hormone replacement therapy and supplements? Hormone therapy products are effective for treating symptoms that are associated with menopause, such as hot flashes and night sweats. Hormone replacement carries certain risks, especially as you become older. If you are thinking about using estrogen or estrogen with progestin treatments, discuss the benefits and risks with your health care provider. What should I know about heart disease and stroke? Heart disease, heart attack, and stroke become more likely as you age. This may be due, in part, to the hormonal changes that your body experiences during menopause. These can affect how your body processes dietary fats, triglycerides, and cholesterol. Heart attack and stroke are both medical emergencies. There are many things that you can do to help prevent heart disease  and stroke:  Have your blood pressure checked at least every 1-2 years. High blood pressure causes heart disease and increases the risk of stroke.  If you are 53-22 years old, ask your health care provider if you should take aspirin to prevent a heart attack or a stroke.  Do not use any tobacco products, including cigarettes, chewing tobacco, or electronic cigarettes. If you need help quitting, ask your health care provider.  It is important to eat a healthy diet and maintain a healthy weight. ? Be sure to include plenty of vegetables, fruits, low-fat dairy products, and lean protein. ? Avoid eating foods that are high in solid fats, added sugars, or salt (sodium).  Get regular exercise. This is one of the most important things that you can do for your health. ? Try to exercise for at least 150 minutes each week. The type of exercise that you do should increase your heart rate and make you sweat. This is known as moderate-intensity exercise. ? Try to do strengthening exercises at least twice each week. Do these in addition to the moderate-intensity exercise.  Know your numbers.Ask your health care provider to check your cholesterol and your blood glucose. Continue to have your blood tested as directed by your health care provider.  What should I know about cancer screening? There are several types of cancer. Take the following steps to reduce your risk and to catch any cancer development as early as possible. Breast Cancer  Practice breast self-awareness. ? This means understanding how your breasts normally appear and feel. ? It also means doing regular breast self-exams. Let your health care provider know about any changes, no matter how small.  If you are 40  or older, have a clinician do a breast exam (clinical breast exam or CBE) every year. Depending on your age, family history, and medical history, it may be recommended that you also have a yearly breast X-ray (mammogram).  If you  have a family history of breast cancer, talk with your health care provider about genetic screening.  If you are at high risk for breast cancer, talk with your health care provider about having an MRI and a mammogram every year.  Breast cancer (BRCA) gene test is recommended for women who have family members with BRCA-related cancers. Results of the assessment will determine the need for genetic counseling and BRCA1 and for BRCA2 testing. BRCA-related cancers include these types: ? Breast. This occurs in males or females. ? Ovarian. ? Tubal. This may also be called fallopian tube cancer. ? Cancer of the abdominal or pelvic lining (peritoneal cancer). ? Prostate. ? Pancreatic.  Cervical, Uterine, and Ovarian Cancer Your health care provider may recommend that you be screened regularly for cancer of the pelvic organs. These include your ovaries, uterus, and vagina. This screening involves a pelvic exam, which includes checking for microscopic changes to the surface of your cervix (Pap test).  For women ages 21-65, health care providers may recommend a pelvic exam and a Pap test every three years. For women ages 79-65, they may recommend the Pap test and pelvic exam, combined with testing for human papilloma virus (HPV), every five years. Some types of HPV increase your risk of cervical cancer. Testing for HPV may also be done on women of any age who have unclear Pap test results.  Other health care providers may not recommend any screening for nonpregnant women who are considered low risk for pelvic cancer and have no symptoms. Ask your health care provider if a screening pelvic exam is right for you.  If you have had past treatment for cervical cancer or a condition that could lead to cancer, you need Pap tests and screening for cancer for at least 20 years after your treatment. If Pap tests have been discontinued for you, your risk factors (such as having a new sexual partner) need to be  reassessed to determine if you should start having screenings again. Some women have medical problems that increase the chance of getting cervical cancer. In these cases, your health care provider may recommend that you have screening and Pap tests more often.  If you have a family history of uterine cancer or ovarian cancer, talk with your health care provider about genetic screening.  If you have vaginal bleeding after reaching menopause, tell your health care provider.  There are currently no reliable tests available to screen for ovarian cancer.  Lung Cancer Lung cancer screening is recommended for adults 69-62 years old who are at high risk for lung cancer because of a history of smoking. A yearly low-dose CT scan of the lungs is recommended if you:  Currently smoke.  Have a history of at least 30 pack-years of smoking and you currently smoke or have quit within the past 15 years. A pack-year is smoking an average of one pack of cigarettes per day for one year.  Yearly screening should:  Continue until it has been 15 years since you quit.  Stop if you develop a health problem that would prevent you from having lung cancer treatment.  Colorectal Cancer  This type of cancer can be detected and can often be prevented.  Routine colorectal cancer screening usually begins at  age 42 and continues through age 45.  If you have risk factors for colon cancer, your health care provider may recommend that you be screened at an earlier age.  If you have a family history of colorectal cancer, talk with your health care provider about genetic screening.  Your health care provider may also recommend using home test kits to check for hidden blood in your stool.  A small camera at the end of a tube can be used to examine your colon directly (sigmoidoscopy or colonoscopy). This is done to check for the earliest forms of colorectal cancer.  Direct examination of the colon should be repeated every  5-10 years until age 71. However, if early forms of precancerous polyps or small growths are found or if you have a family history or genetic risk for colorectal cancer, you may need to be screened more often.  Skin Cancer  Check your skin from head to toe regularly.  Monitor any moles. Be sure to tell your health care provider: ? About any new moles or changes in moles, especially if there is a change in a mole's shape or color. ? If you have a mole that is larger than the size of a pencil eraser.  If any of your family members has a history of skin cancer, especially at a Khup Sapia age, talk with your health care provider about genetic screening.  Always use sunscreen. Apply sunscreen liberally and repeatedly throughout the day.  Whenever you are outside, protect yourself by wearing long sleeves, pants, a wide-brimmed hat, and sunglasses.  What should I know about osteoporosis? Osteoporosis is a condition in which bone destruction happens more quickly than new bone creation. After menopause, you may be at an increased risk for osteoporosis. To help prevent osteoporosis or the bone fractures that can happen because of osteoporosis, the following is recommended:  If you are 46-71 years old, get at least 1,000 mg of calcium and at least 600 mg of vitamin D per day.  If you are older than age 55 but younger than age 65, get at least 1,200 mg of calcium and at least 600 mg of vitamin D per day.  If you are older than age 54, get at least 1,200 mg of calcium and at least 800 mg of vitamin D per day.  Smoking and excessive alcohol intake increase the risk of osteoporosis. Eat foods that are rich in calcium and vitamin D, and do weight-bearing exercises several times each week as directed by your health care provider. What should I know about how menopause affects my mental health? Depression may occur at any age, but it is more common as you become older. Common symptoms of depression  include:  Low or sad mood.  Changes in sleep patterns.  Changes in appetite or eating patterns.  Feeling an overall lack of motivation or enjoyment of activities that you previously enjoyed.  Frequent crying spells.  Talk with your health care provider if you think that you are experiencing depression. What should I know about immunizations? It is important that you get and maintain your immunizations. These include:  Tetanus, diphtheria, and pertussis (Tdap) booster vaccine.  Influenza every year before the flu season begins.  Pneumonia vaccine.  Shingles vaccine.  Your health care provider may also recommend other immunizations. This information is not intended to replace advice given to you by your health care provider. Make sure you discuss any questions you have with your health care provider. Document Released: 07/27/2005  Document Revised: 12/23/2015 Document Reviewed: 03/08/2015 [employer] Interactive Patient Education  2018 [employer]  

## 2017-01-03 ENCOUNTER — Other Ambulatory Visit: Payer: Self-pay | Admitting: Women's Health

## 2017-01-03 DIAGNOSIS — R7989 Other specified abnormal findings of blood chemistry: Secondary | ICD-10-CM

## 2017-01-03 DIAGNOSIS — R945 Abnormal results of liver function studies: Principal | ICD-10-CM

## 2017-01-05 ENCOUNTER — Encounter: Payer: Self-pay | Admitting: Women's Health

## 2017-02-15 ENCOUNTER — Other Ambulatory Visit: Payer: Self-pay | Admitting: Women's Health

## 2017-02-15 DIAGNOSIS — F32A Depression, unspecified: Secondary | ICD-10-CM

## 2017-02-15 DIAGNOSIS — F329 Major depressive disorder, single episode, unspecified: Secondary | ICD-10-CM

## 2017-02-15 NOTE — Telephone Encounter (Signed)
Current annual exam July 2018. KW CMA

## 2017-02-26 ENCOUNTER — Encounter: Payer: Self-pay | Admitting: Women's Health

## 2017-02-26 NOTE — Telephone Encounter (Signed)
Telephone call, had some shortness of breath [place] with perspiration, numerous family members with heart disease. Lipid panel slightly elevated. No shortness of breath with stairs or normal daily activities, does have some perspiration but is postmenopausal. Reviewed importance of following up with ER visit if become short of breath at rest.

## 2017-03-22 ENCOUNTER — Encounter: Payer: Self-pay | Admitting: Women's Health

## 2017-03-26 ENCOUNTER — Other Ambulatory Visit: Payer: 59

## 2017-03-26 ENCOUNTER — Ambulatory Visit (INDEPENDENT_AMBULATORY_CARE_PROVIDER_SITE_OTHER): Payer: 59 | Admitting: *Deleted

## 2017-03-26 DIAGNOSIS — R945 Abnormal results of liver function studies: Principal | ICD-10-CM

## 2017-03-26 DIAGNOSIS — Z23 Encounter for immunization: Secondary | ICD-10-CM

## 2017-03-26 DIAGNOSIS — R7989 Other specified abnormal findings of blood chemistry: Secondary | ICD-10-CM

## 2017-03-26 LAB — ALT: ALT: 28 U/L (ref 6–29)

## 2017-03-26 LAB — AST: AST: 22 U/L (ref 10–35)

## 2017-11-13 ENCOUNTER — Encounter: Payer: Self-pay | Admitting: Women's Health

## 2017-11-13 ENCOUNTER — Other Ambulatory Visit: Payer: Self-pay | Admitting: Women's Health

## 2017-11-13 MED ORDER — FLUOXETINE HCL 10 MG PO TABS: ORAL_TABLET | ORAL | 3 refills | Status: DC

## 2017-12-04 ENCOUNTER — Encounter: Payer: Self-pay | Admitting: Women's Health

## 2018-01-07 ENCOUNTER — Ambulatory Visit (INDEPENDENT_AMBULATORY_CARE_PROVIDER_SITE_OTHER): Payer: 59 | Admitting: Women's Health

## 2018-01-07 ENCOUNTER — Encounter: Payer: Self-pay | Admitting: Women's Health

## 2018-01-07 VITALS — BP 118/80 | Ht 63.0 in | Wt 186.0 lb

## 2018-01-07 DIAGNOSIS — Z01419 Encounter for gynecological examination (general) (routine) without abnormal findings: Secondary | ICD-10-CM

## 2018-01-07 DIAGNOSIS — B009 Herpesviral infection, unspecified: Secondary | ICD-10-CM | POA: Diagnosis not present

## 2018-01-07 MED ORDER — VALACYCLOVIR HCL 500 MG PO TABS: ORAL_TABLET | ORAL | 1 refills | Status: DC

## 2018-01-07 NOTE — Patient Instructions (Addendum)
Health Maintenance for Postmenopausal Women Menopause is a normal process in which your reproductive ability comes to an end. This process happens gradually over a span of months to years, usually between the ages of 22 and 9. Menopause is complete when you have missed 12 consecutive menstrual periods. It is important to talk with your health care provider about some of the most common conditions that affect postmenopausal women, such as heart disease, cancer, and bone loss (osteoporosis). Adopting a healthy lifestyle and getting preventive care can help to promote your health and wellness. Those actions can also lower your chances of developing some of these common conditions. What should I know about menopause? During menopause, you may experience a number of symptoms, such as:  Moderate-to-severe hot flashes.  Night sweats.  Decrease in sex drive.  Mood swings.  Headaches.  Tiredness.  Irritability.  Memory problems.  Insomnia.  Choosing to treat or not to treat menopausal changes is an individual decision that you make with your health care provider. What should I know about hormone replacement therapy and supplements? Hormone therapy products are effective for treating symptoms that are associated with menopause, such as hot flashes and night sweats. Hormone replacement carries certain risks, especially as you become older. If you are thinking about using estrogen or estrogen with progestin treatments, discuss the benefits and risks with your health care provider. What should I know about heart disease and stroke? Heart disease, heart attack, and stroke become more likely as you age. This may be due, in part, to the hormonal changes that your body experiences during menopause. These can affect how your body processes dietary fats, triglycerides, and cholesterol. Heart attack and stroke are both medical emergencies. There are many things that you can do to help prevent heart disease  and stroke:  Have your blood pressure checked at least every 1-2 years. High blood pressure causes heart disease and increases the risk of stroke.  If you are 53-22 years old, ask your health care provider if you should take aspirin to prevent a heart attack or a stroke.  Do not use any tobacco products, including cigarettes, chewing tobacco, or electronic cigarettes. If you need help quitting, ask your health care provider.  It is important to eat a healthy diet and maintain a healthy weight. ? Be sure to include plenty of vegetables, fruits, low-fat dairy products, and lean protein. ? Avoid eating foods that are high in solid fats, added sugars, or salt (sodium).  Get regular exercise. This is one of the most important things that you can do for your health. ? Try to exercise for at least 150 minutes each week. The type of exercise that you do should increase your heart rate and make you sweat. This is known as moderate-intensity exercise. ? Try to do strengthening exercises at least twice each week. Do these in addition to the moderate-intensity exercise.  Know your numbers.Ask your health care provider to check your cholesterol and your blood glucose. Continue to have your blood tested as directed by your health care provider.  What should I know about cancer screening? There are several types of cancer. Take the following steps to reduce your risk and to catch any cancer development as early as possible. Breast Cancer  Practice breast self-awareness. ? This means understanding how your breasts normally appear and feel. ? It also means doing regular breast self-exams. Let your health care provider know about any changes, no matter how small.  If you are 40  or older, have a clinician do a breast exam (clinical breast exam or CBE) every year. Depending on your age, family history, and medical history, it may be recommended that you also have a yearly breast X-ray (mammogram).  If you  have a family history of breast cancer, talk with your health care provider about genetic screening.  If you are at high risk for breast cancer, talk with your health care provider about having an MRI and a mammogram every year.  Breast cancer (BRCA) gene test is recommended for women who have family members with BRCA-related cancers. Results of the assessment will determine the need for genetic counseling and BRCA1 and for BRCA2 testing. BRCA-related cancers include these types: ? Breast. This occurs in males or females. ? Ovarian. ? Tubal. This may also be called fallopian tube cancer. ? Cancer of the abdominal or pelvic lining (peritoneal cancer). ? Prostate. ? Pancreatic.  Cervical, Uterine, and Ovarian Cancer Your health care provider may recommend that you be screened regularly for cancer of the pelvic organs. These include your ovaries, uterus, and vagina. This screening involves a pelvic exam, which includes checking for microscopic changes to the surface of your cervix (Pap test).  For women ages 21-65, health care providers may recommend a pelvic exam and a Pap test every three years. For women ages 79-65, they may recommend the Pap test and pelvic exam, combined with testing for human papilloma virus (HPV), every five years. Some types of HPV increase your risk of cervical cancer. Testing for HPV may also be done on women of any age who have unclear Pap test results.  Other health care providers may not recommend any screening for nonpregnant women who are considered low risk for pelvic cancer and have no symptoms. Ask your health care provider if a screening pelvic exam is right for you.  If you have had past treatment for cervical cancer or a condition that could lead to cancer, you need Pap tests and screening for cancer for at least 20 years after your treatment. If Pap tests have been discontinued for you, your risk factors (such as having a new sexual partner) need to be  reassessed to determine if you should start having screenings again. Some women have medical problems that increase the chance of getting cervical cancer. In these cases, your health care provider may recommend that you have screening and Pap tests more often.  If you have a family history of uterine cancer or ovarian cancer, talk with your health care provider about genetic screening.  If you have vaginal bleeding after reaching menopause, tell your health care provider.  There are currently no reliable tests available to screen for ovarian cancer.  Lung Cancer Lung cancer screening is recommended for adults 69-62 years old who are at high risk for lung cancer because of a history of smoking. A yearly low-dose CT scan of the lungs is recommended if you:  Currently smoke.  Have a history of at least 30 pack-years of smoking and you currently smoke or have quit within the past 15 years. A pack-year is smoking an average of one pack of cigarettes per day for one year.  Yearly screening should:  Continue until it has been 15 years since you quit.  Stop if you develop a health problem that would prevent you from having lung cancer treatment.  Colorectal Cancer  This type of cancer can be detected and can often be prevented.  Routine colorectal cancer screening usually begins at  age 42 and continues through age 45.  If you have risk factors for colon cancer, your health care provider may recommend that you be screened at an earlier age.  If you have a family history of colorectal cancer, talk with your health care provider about genetic screening.  Your health care provider may also recommend using home test kits to check for hidden blood in your stool.  A small camera at the end of a tube can be used to examine your colon directly (sigmoidoscopy or colonoscopy). This is done to check for the earliest forms of colorectal cancer.  Direct examination of the colon should be repeated every  5-10 years until age 71. However, if early forms of precancerous polyps or small growths are found or if you have a family history or genetic risk for colorectal cancer, you may need to be screened more often.  Skin Cancer  Check your skin from head to toe regularly.  Monitor any moles. Be sure to tell your health care provider: ? About any new moles or changes in moles, especially if there is a change in a mole's shape or color. ? If you have a mole that is larger than the size of a pencil eraser.  If any of your family members has a history of skin cancer, especially at a young age, talk with your health care provider about genetic screening.  Always use sunscreen. Apply sunscreen liberally and repeatedly throughout the day.  Whenever you are outside, protect yourself by wearing long sleeves, pants, a wide-brimmed hat, and sunglasses.  What should I know about osteoporosis? Osteoporosis is a condition in which bone destruction happens more quickly than new bone creation. After menopause, you may be at an increased risk for osteoporosis. To help prevent osteoporosis or the bone fractures that can happen because of osteoporosis, the following is recommended:  If you are 46-71 years old, get at least 1,000 mg of calcium and at least 600 mg of vitamin D per day.  If you are older than age 55 but younger than age 65, get at least 1,200 mg of calcium and at least 600 mg of vitamin D per day.  If you are older than age 54, get at least 1,200 mg of calcium and at least 800 mg of vitamin D per day.  Smoking and excessive alcohol intake increase the risk of osteoporosis. Eat foods that are rich in calcium and vitamin D, and do weight-bearing exercises several times each week as directed by your health care provider. What should I know about how menopause affects my mental health? Depression may occur at any age, but it is more common as you become older. Common symptoms of depression  include:  Low or sad mood.  Changes in sleep patterns.  Changes in appetite or eating patterns.  Feeling an overall lack of motivation or enjoyment of activities that you previously enjoyed.  Frequent crying spells.  Talk with your health care provider if you think that you are experiencing depression. What should I know about immunizations? It is important that you get and maintain your immunizations. These include:  Tetanus, diphtheria, and pertussis (Tdap) booster vaccine.  Influenza every year before the flu season begins.  Pneumonia vaccine.  Shingles vaccine.  Your health care provider may also recommend other immunizations. This information is not intended to replace advice given to you by your health care provider. Make sure you discuss any questions you have with your health care provider. Document Released: 07/27/2005  Document Revised: 12/23/2015 Document Reviewed: 03/08/2015 Elsevier Interactive Patient Education  2018 [place] After being diagnosed with an anxiety disorder, you may be relieved to know why you have felt or behaved a certain way. It is natural to also feel overwhelmed about the treatment ahead and what it will mean for your life. With care and support, you can manage this condition and recover from it. How to cope with anxiety Dealing with stress Stress is your body's reaction to life changes and events, both good and bad. Stress can last just a few hours or it can be ongoing. Stress can play a major role in anxiety, so it is important to learn both how to cope with stress and how to think about it differently. Talk with your health care provider or a counselor to learn more about stress reduction. He or she may suggest some stress reduction techniques, such as:  Music therapy. This can include creating or listening to music that you enjoy and that inspires you.  Mindfulness-based meditation. This involves being aware of your  normal breaths, rather than trying to control your breathing. It can be done while sitting or walking.  Centering prayer. This is a kind of meditation that involves focusing on a word, phrase, or sacred image that is meaningful to you and that brings you peace.  Deep breathing. To do this, expand your stomach and inhale slowly through your nose. Hold your breath for 3-5 seconds. Then exhale slowly, allowing your stomach muscles to relax.  Self-talk. This is a skill where you identify thought patterns that lead to anxiety reactions and correct those thoughts.  Muscle relaxation. This involves tensing muscles then relaxing them.  Choose a stress reduction technique that fits your lifestyle and personality. Stress reduction techniques take time and practice. Set aside 5-15 minutes a day to do them. Therapists can offer training in these techniques. The training may be covered by some insurance plans. Other things you can do to manage stress include:  Keeping a stress diary. This can help you learn what triggers your stress and ways to control your response.  Thinking about how you respond to certain situations. You may not be able to control everything, but you can control your reaction.  Making time for activities that help you relax, and not feeling guilty about spending your time in this way.  Therapy combined with coping and stress-reduction skills provides the best chance for successful treatment. Medicines Medicines can help ease symptoms. Medicines for anxiety include:  Anti-anxiety drugs.  Antidepressants.  Beta-blockers.  Medicines may be used as the main treatment for anxiety disorder, along with therapy, or if other treatments are not working. Medicines should be prescribed by a health care provider. Relationships Relationships can play a big part in helping you recover. Try to spend more time connecting with trusted friends and family members. Consider going to couples  counseling, taking family education classes, or going to family therapy. Therapy can help you and others better understand the condition. How to recognize changes in your condition Everyone has a different response to treatment for anxiety. Recovery from anxiety happens when symptoms decrease and stop interfering with your daily activities at home or work. This may mean that you will start to:  Have better concentration and focus.  Sleep better.  Be less irritable.  Have more energy.  Have improved memory.  It is important to recognize when your condition is getting worse. Contact your health care provider  if your symptoms interfere with home or work and you do not feel like your condition is improving. Where to find help and support: You can get help and support from these sources:  Self-help groups.  Online and [employer].  A trusted spiritual leader.  Couples counseling.  Family education classes.  Family therapy.  Follow these instructions at home:  Eat a healthy diet that includes plenty of vegetables, fruits, whole grains, low-fat dairy products, and lean protein. Do not eat a lot of foods that are high in solid fats, added sugars, or salt.  Exercise. Most adults should do the following: ? Exercise for at least 150 minutes each week. The exercise should increase your heart rate and make you sweat (moderate-intensity exercise). ? Strengthening exercises at least twice a week.  Cut down on caffeine, tobacco, alcohol, and other potentially harmful substances.  Get the right amount and quality of sleep. Most adults need 7-9 hours of sleep each night.  Make choices that simplify your life.  Take over-the-counter and prescription medicines only as told by your health care provider.  Avoid caffeine, alcohol, and certain over-the-counter cold medicines. These may make you feel worse. Ask your pharmacist which medicines to avoid.  Keep all follow-up visits as  told by your health care provider. This is important. Questions to ask your health care provider  Would I benefit from therapy?  How often should I follow up with a health care provider?  How long do I need to take medicine?  Are there any long-term side effects of my medicine?  Are there any alternatives to taking medicine? Contact a health care provider if:  You have a hard time staying focused or finishing daily tasks.  You spend many hours a day feeling worried about everyday life.  You become exhausted by worry.  You start to have headaches, feel tense, or have nausea.  You urinate more than normal.  You have diarrhea. Get help right away if:  You have a racing heart and shortness of breath.  You have thoughts of hurting yourself or others. If you ever feel like you may hurt yourself or others, or have thoughts about taking your own life, get help right away. You can go to your nearest emergency department or call:  Your local emergency services (911 in the [place]  A suicide crisis helpline, such as the [facility] at 404-035-1858. This is open 24-hours a day.  Summary  Taking steps to deal with stress can help calm you.  Medicines cannot cure anxiety disorders, but they can help ease symptoms.  Family, friends, and partners can play a big part in helping you recover from an anxiety disorder. This information is not intended to replace advice given to you by your health care provider. Make sure you discuss any questions you have with your health care provider. Document Released: 05/29/2016 Document Revised: 05/29/2016 Document Reviewed: 05/29/2016 Elsevier Interactive Patient Education  [employer].

## 2018-01-07 NOTE — Progress Notes (Signed)
Michelle Huynh 1960-10-06 [id number]    History:    Presents for annual exam.  Postmenopausal with no bleeding, stopped HRT last year and doing okay.    2009 her option.  Normal Pap and mammogram history.  2019 small benign polyps on colonoscopy, history of colitis..  2016 normal DEXA T score -0.5 at spine.    Past medical history, past surgical history, family history and social history were all reviewed and documented in the EPIC chart.  Works at [employer]-.  Father hypertension, mother diabetes, hypertension and heart disease.  ROS:  A ROS was performed and pertinent positives and negatives are included.  Exam:  Vitals:   01/07/18 0807  BP: 118/80  Weight: 186 lb (84.4 kg)  Height: 5\' 3"  (1.6 m)   Body mass index is 32.95 kg/m.   General appearance:  Normal Thyroid:  Symmetrical, normal in size, without palpable masses or nodularity. Respiratory  Auscultation:  Clear without wheezing or rhonchi Cardiovascular  Auscultation:  Regular rate, without rubs, murmurs or gallops  Edema/varicosities:  Not grossly evident Abdominal  Soft,nontender, without masses, guarding or rebound.  Liver/spleen:  No organomegaly noted  Hernia:  None appreciated  Skin  Inspection:  Grossly normal   Breasts: Examined lying and sitting.     Right: Without masses, retractions, discharge or axillary adenopathy.     Left: Without masses, retractions, discharge or axillary adenopathy. Gentitourinary   Inguinal/mons:  Normal without inguinal adenopathy  External genitalia:  Normal  BUS/Urethra/Skene's glands:  Normal  Vagina:  Normal  Cervix:  Normal  Uterus:  normal in size, shape and contour.  Midline and mobile  Adnexa/parametria:     Rt: Without masses or tenderness.   Lt: Without masses or tenderness.  Anus and perineum: Normal  Digital rectal exam: Normal sphincter tone without palpated masses or tenderness  Assessment/Plan:  57 y.o. MWF G0 +2 stepchildren for annual exam with no  complaints.  Postmenopausal no HRT with no bleeding History of colitis Dr. Collene Mares manages [facility]-primary care HSV rare outbreaks  Plan: SBE's, continue annual screening mammogram, calcium rich foods, vitamin D 2000 daily encouraged.  Reviewed importance of weightbearing, balance type exercise, home safety, fall prevention discussed.  Valtrex 500 twice daily for 3 to 5 days as needed.  Prescription given.  Pap with HR HPV typing, new screening guidelines reviewed.   [place], 8:44 AM 01/07/2018

## 2018-01-09 LAB — URINALYSIS, COMPLETE W/RFL CULTURE
BILIRUBIN URINE: NEGATIVE
Bacteria, UA: NONE SEEN /HPF
Glucose, UA: NEGATIVE
Hgb urine dipstick: NEGATIVE
Hyaline Cast: NONE SEEN /LPF
Ketones, ur: NEGATIVE
NITRITES URINE, INITIAL: NEGATIVE
PROTEIN: NEGATIVE
RBC / HPF: NONE SEEN /HPF (ref 0–2)
Specific Gravity, Urine: 1.018 (ref 1.001–1.03)
WBC UA: NONE SEEN /HPF (ref 0–5)
pH: 7.5 (ref 5.0–8.0)

## 2018-01-09 LAB — PAP, TP IMAGING W/ HPV RNA, RFLX HPV TYPE 16,18/45: HPV DNA High Risk: NOT DETECTED

## 2018-01-09 LAB — URINE CULTURE
MICRO NUMBER: 90875544
SPECIMEN QUALITY: ADEQUATE

## 2018-01-09 LAB — CULTURE INDICATED

## 2018-01-30 ENCOUNTER — Other Ambulatory Visit: Payer: Self-pay | Admitting: Women's Health

## 2018-01-30 MED ORDER — FLUOXETINE HCL 10 MG PO CAPS: 30.0000 mg | ORAL_CAPSULE | Freq: Every day | ORAL | 1 refills | Status: DC

## 2018-03-20 ENCOUNTER — Other Ambulatory Visit: Payer: Self-pay | Admitting: Women's Health

## 2018-03-20 MED ORDER — FLUOXETINE HCL 20 MG PO TABS: 20.0000 mg | ORAL_TABLET | Freq: Every day | ORAL | 3 refills | Status: DC

## 2018-03-24 ENCOUNTER — Other Ambulatory Visit: Payer: Self-pay

## 2018-08-20 ENCOUNTER — Encounter: Payer: Self-pay | Admitting: Women's Health

## 2018-08-22 ENCOUNTER — Encounter: Payer: Self-pay | Admitting: Women's Health

## 2018-08-22 NOTE — Telephone Encounter (Signed)
Michelle Huynh - Please advise. KW CMA

## 2019-02-06 ENCOUNTER — Other Ambulatory Visit: Payer: Self-pay

## 2019-02-09 ENCOUNTER — Encounter: Payer: Self-pay | Admitting: Women's Health

## 2019-02-09 ENCOUNTER — Ambulatory Visit (INDEPENDENT_AMBULATORY_CARE_PROVIDER_SITE_OTHER): Payer: 59 | Admitting: Women's Health

## 2019-02-09 ENCOUNTER — Other Ambulatory Visit: Payer: Self-pay

## 2019-02-09 VITALS — BP 120/82 | Ht 63.0 in | Wt 190.0 lb

## 2019-02-09 DIAGNOSIS — Z01419 Encounter for gynecological examination (general) (routine) without abnormal findings: Secondary | ICD-10-CM | POA: Diagnosis not present

## 2019-02-09 DIAGNOSIS — Z13220 Encounter for screening for lipoid disorders: Secondary | ICD-10-CM

## 2019-02-09 DIAGNOSIS — B009 Herpesviral infection, unspecified: Secondary | ICD-10-CM | POA: Diagnosis not present

## 2019-02-09 MED ORDER — FLUOXETINE HCL 20 MG PO TABS: 20.0000 mg | ORAL_TABLET | Freq: Every day | ORAL | 4 refills | Status: DC

## 2019-02-09 MED ORDER — VALACYCLOVIR HCL 500 MG PO TABS: ORAL_TABLET | ORAL | 1 refills | Status: DC

## 2019-02-09 NOTE — Patient Instructions (Addendum)
Vit D 2000 iu  keep walking  Health Maintenance for Postmenopausal Women Menopause is a normal process in which your ability to get pregnant comes to an end. This process happens slowly over many months or years, usually between the ages of 7 and 91. Menopause is complete when you have missed your menstrual periods for 12 months. It is important to talk with your health care provider about some of the most common conditions that affect women after menopause (postmenopausal women). These include heart disease, cancer, and bone loss (osteoporosis). Adopting a healthy lifestyle and getting preventive care can help to promote your health and wellness. The actions you take can also lower your chances of developing some of these common conditions. What should I know about menopause? During menopause, you may get a number of symptoms, such as:  Hot flashes. These can be moderate or severe.  Night sweats.  Decrease in sex drive.  Mood swings.  Headaches.  Tiredness.  Irritability.  Memory problems.  Insomnia. Choosing to treat or not to treat these symptoms is a decision that you make with your health care provider. Do I need hormone replacement therapy?  Hormone replacement therapy is effective in treating symptoms that are caused by menopause, such as hot flashes and night sweats.  Hormone replacement carries certain risks, especially as you become older. If you are thinking about using estrogen or estrogen with progestin, discuss the benefits and risks with your health care provider. What is my risk for heart disease and stroke? The risk of heart disease, heart attack, and stroke increases as you age. One of the causes may be a change in the body's hormones during menopause. This can affect how your body uses dietary fats, triglycerides, and cholesterol. Heart attack and stroke are medical emergencies. There are many things that you can do to help prevent heart disease and stroke. Watch  your blood pressure  High blood pressure causes heart disease and increases the risk of stroke. This is more likely to develop in people who have high blood pressure readings, are of African descent, or are overweight.  Have your blood pressure checked: ? Every 3-5 years if you are 32-51 years of age. ? Every year if you are 70 years old or older. Eat a healthy diet   Eat a diet that includes plenty of vegetables, fruits, low-fat dairy products, and lean protein.  Do not eat a lot of foods that are high in solid fats, added sugars, or sodium. Get regular exercise Get regular exercise. This is one of the most important things you can do for your health. Most adults should:  Try to exercise for at least 150 minutes each week. The exercise should increase your heart rate and make you sweat (moderate-intensity exercise).  Try to do strengthening exercises at least twice each week. Do these in addition to the moderate-intensity exercise.  Spend less time sitting. Even light physical activity can be beneficial. Other tips  Work with your health care provider to achieve or maintain a healthy weight.  Do not use any products that contain nicotine or tobacco, such as cigarettes, e-cigarettes, and chewing tobacco. If you need help quitting, ask your health care provider.  Know your numbers. Ask your health care provider to check your cholesterol and your blood sugar (glucose). Continue to have your blood tested as directed by your health care provider. Do I need screening for cancer? Depending on your health history and family history, you may need to have cancer  screening at different stages of your life. This may include screening for:  Breast cancer.  Cervical cancer.  Lung cancer.  Colorectal cancer. What is my risk for osteoporosis? After menopause, you may be at increased risk for osteoporosis. Osteoporosis is a condition in which bone destruction happens more quickly than new bone  creation. To help prevent osteoporosis or the bone fractures that can happen because of osteoporosis, you may take the following actions:  If you are 22-64 years old, get at least 1,000 mg of calcium and at least 600 mg of vitamin D per day.  If you are older than age 70 but younger than age 69, get at least 1,200 mg of calcium and at least 600 mg of vitamin D per day.  If you are older than age 28, get at least 1,200 mg of calcium and at least 800 mg of vitamin D per day. Smoking and drinking excessive alcohol increase the risk of osteoporosis. Eat foods that are rich in calcium and vitamin D, and do weight-bearing exercises several times each week as directed by your health care provider. How does menopause affect my mental health? Depression may occur at any age, but it is more common as you become older. Common symptoms of depression include:  Low or sad mood.  Changes in sleep patterns.  Changes in appetite or eating patterns.  Feeling an overall lack of motivation or enjoyment of activities that you previously enjoyed.  Frequent crying spells. Talk with your health care provider if you think that you are experiencing depression. General instructions See your health care provider for regular wellness exams and vaccines. This may include:  Scheduling regular health, dental, and eye exams.  Getting and maintaining your vaccines. These include: ? Influenza vaccine. Get this vaccine each year before the flu season begins. ? Pneumonia vaccine. ? Shingles vaccine. ? Tetanus, diphtheria, and pertussis (Tdap) booster vaccine. Your health care provider may also recommend other immunizations. Tell your health care provider if you have ever been abused or do not feel safe at home. Summary  Menopause is a normal process in which your ability to get pregnant comes to an end.  This condition causes hot flashes, night sweats, decreased interest in sex, mood swings, headaches, or lack of  sleep.  Treatment for this condition may include hormone replacement therapy.  Take actions to keep yourself healthy, including exercising regularly, eating a healthy diet, watching your weight, and checking your blood pressure and blood sugar levels.  Get screened for cancer and depression. Make sure that you are up to date with all your vaccines. This information is not intended to replace advice given to you by your health care provider. Make sure you discuss any questions you have with your health care provider. Document Released: 07/27/2005 Document Revised: 05/28/2018 Document Reviewed: 05/28/2018 Elsevier Patient Education  2020 [place] [employer] for Diabetes Mellitus, Adult  Carbohydrate counting is a method of keeping track of how many carbohydrates you eat. Eating carbohydrates naturally increases the amount of sugar (glucose) in the blood. Counting how many carbohydrates you eat helps keep your blood glucose within normal limits, which helps you manage your diabetes (diabetes mellitus). It is important to know how many carbohydrates you can safely have in each meal. This is different for every person. A diet and nutrition specialist (registered dietitian) can help you make a meal plan and calculate how many carbohydrates you should have at each meal and snack. Carbohydrates are found in the  following foods:  Grains, such as breads and cereals.  Dried beans and soy products.  Starchy vegetables, such as potatoes, peas, and corn.  Fruit and fruit juices.  Milk and yogurt.  Sweets and snack foods, such as cake, cookies, candy, chips, and soft drinks. How do I count carbohydrates? There are two ways to count carbohydrates in food. You can use either of the methods or a combination of both. Reading "Nutrition Facts" on packaged food The "Nutrition Facts" list is included on the labels of almost all packaged foods and beverages in the [place] It includes:  The  serving size.  Information about nutrients in each serving, including the grams (g) of carbohydrate per serving. To use the "Nutrition Facts":  Decide how many servings you will have.  Multiply the number of servings by the number of carbohydrates per serving.  The resulting number is the total amount of carbohydrates that you will be having. Learning standard serving sizes of other foods When you eat carbohydrate foods that are not packaged or do not include "Nutrition Facts" on the label, you need to measure the servings in order to count the amount of carbohydrates:  Measure the foods that you will eat with a food scale or measuring cup, if needed.  Decide how many standard-size servings you will eat.  Multiply the number of servings by 15. Most carbohydrate-rich foods have about 15 g of carbohydrates per serving. ? For example, if you eat 8 oz (170 g) of strawberries, you will have eaten 2 servings and 30 g of carbohydrates (2 servings x 15 g = 30 g).  For foods that have more than one food mixed, such as soups and casseroles, you must count the carbohydrates in each food that is included. The following list contains standard serving sizes of common carbohydrate-rich foods. Each of these servings has about 15 g of carbohydrates:   hamburger bun or  English muffin.   oz (15 mL) syrup.   oz (14 g) jelly.  1 slice of bread.  1 six-inch tortilla.  3 oz (85 g) cooked rice or pasta.  4 oz (113 g) cooked dried beans.  4 oz (113 g) starchy vegetable, such as peas, corn, or potatoes.  4 oz (113 g) hot cereal.  4 oz (113 g) mashed potatoes or  of a large baked potato.  4 oz (113 g) canned or frozen fruit.  4 oz (120 mL) fruit juice.  4-6 crackers.  6 chicken nuggets.  6 oz (170 g) unsweetened dry cereal.  6 oz (170 g) plain fat-free yogurt or yogurt sweetened with artificial sweeteners.  8 oz (240 mL) milk.  8 oz (170 g) fresh fruit or one small piece of fruit.   24 oz (680 g) popped popcorn. Example of carbohydrate counting Sample meal  3 oz (85 g) chicken breast.  6 oz (170 g) brown rice.  4 oz (113 g) corn.  8 oz (240 mL) milk.  8 oz (170 g) strawberries with sugar-free whipped topping. Carbohydrate calculation 1. Identify the foods that contain carbohydrates: ? Rice. ? Corn. ? Milk. ? Strawberries. 2. Calculate how many servings you have of each food: ? 2 servings rice. ? 1 serving corn. ? 1 serving milk. ? 1 serving strawberries. 3. Multiply each number of servings by 15 g: ? 2 servings rice x 15 g = 30 g. ? 1 serving corn x 15 g = 15 g. ? 1 serving milk x 15 g = 15 g. ?  1 serving strawberries x 15 g = 15 g. 4. Add together all of the amounts to find the total grams of carbohydrates eaten: ? 30 g + 15 g + 15 g + 15 g = 75 g of carbohydrates total. Summary  Carbohydrate counting is a method of keeping track of how many carbohydrates you eat.  Eating carbohydrates naturally increases the amount of sugar (glucose) in the blood.  Counting how many carbohydrates you eat helps keep your blood glucose within normal limits, which helps you manage your diabetes.  A diet and nutrition specialist (registered dietitian) can help you make a meal plan and calculate how many carbohydrates you should have at each meal and snack. This information is not intended to replace advice given to you by your health care provider. Make sure you discuss any questions you have with your health care provider. Document Released: 06/04/2005 Document Revised: 12/27/2016 Document Reviewed: 11/16/2015 Elsevier Patient Education  2020 [employer].

## 2019-02-09 NOTE — Progress Notes (Signed)
Michelle Huynh 08/04/1960 [id number]    History:    Presents for annual exam.  Postmenopausal on no HRT with no bleeding.  2009 endometrial ablation.  Normal Pap and mammogram history.  2019 benign colon polyp.  2016 normal DEXA.  History of colitis minimal symptoms, HSV no outbreaks, anxiety and depression stable on Prozac.  Normal CBC, CMP, TSH  at GI  Past medical history, past surgical history, family history and social history were all reviewed and documented in the EPIC chart.  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  58 stepdaughter, 29 year old granddaughter spent much time with her this summer.    ROS:  A ROS was performed and pertinent positives and negatives are included.  Exam:  Vitals:   02/09/19 1059  BP: 120/82  Weight: 190 lb (86.2 kg)  Height: 5\' 3"  (1.6 m)   Body mass index is 33.66 kg/m.   General appearance:  Normal Thyroid:  Symmetrical, normal in size, without palpable masses or nodularity. Respiratory  Auscultation:  Clear without wheezing or rhonchi Cardiovascular  Auscultation:  Regular rate, without rubs, murmurs or gallops  Edema/varicosities:  Not grossly evident Abdominal  Soft,nontender, without masses, guarding or rebound.  Liver/spleen:  No organomegaly noted  Hernia:  None appreciated  Skin  Inspection:  Grossly normal   Breasts: Examined lying and sitting.     Right: Without masses, retractions, discharge or axillary adenopathy.     Left: Without masses, retractions, discharge or axillary adenopathy. Gentitourinary   Inguinal/mons:  Normal without inguinal adenopathy  External genitalia:  Normal  BUS/Urethra/Skene's glands:  Normal  Vagina:  Normal  Cervix:  Normal  Uterus:   normal in size, shape and contour.  Midline and mobile  Adnexa/parametria:     Rt: Without masses or tenderness.   Lt: Without masses or tenderness.  Anus and perineum: Normal  Digital rectal exam: Normal sphincter tone without palpated masses or  tenderness  Assessment/Plan:  58 y.o. MWF G0 1 stepdaughter for annual exam with no complaints.  Postmenopausal/no HRT/no bleeding HSV no outbreaks Anxiety/depression stable on Prozac Obesity Labs-GI  Plan: SBEs, continue annual screening mammogram, calcium rich foods, vitamin D 2000 daily.  Valtrex 500 twice daily for 3 to 5 days as needed, prescription given.  Prozac 20 mg p.o. daily continue counseling as needed.  Self-care, leisure activities encouraged and reviewed.  Aware of importance to increase regular weightbearing and balance type exercise and decrease calorie/carbs.  Pap normal 2019, new screening guidelines reviewed.  Instructed to have fasting lipid panel and fax results to our office.  Reviewed her normal labs from the GI office.    Huel Cote [facility], 11:10 AM 02/09/2019

## 2019-02-10 LAB — URINALYSIS, COMPLETE W/RFL CULTURE
Bacteria, UA: NONE SEEN /HPF
Bilirubin Urine: NEGATIVE
Glucose, UA: NEGATIVE
Hgb urine dipstick: NEGATIVE
Hyaline Cast: NONE SEEN /LPF
Ketones, ur: NEGATIVE
Leukocyte Esterase: NEGATIVE
Nitrites, Initial: NEGATIVE
Protein, ur: NEGATIVE
RBC / HPF: NONE SEEN /HPF (ref 0–2)
Specific Gravity, Urine: 1.012 (ref 1.001–1.03)
Squamous Epithelial / LPF: NONE SEEN /HPF (ref <=5)
WBC, UA: NONE SEEN /HPF (ref 0–5)
pH: 7 (ref 5.0–8.0)

## 2019-02-10 LAB — NO CULTURE INDICATED

## 2019-04-27 ENCOUNTER — Encounter: Payer: Self-pay | Admitting: Women's Health

## 2019-04-27 MED ORDER — FLUOXETINE HCL 20 MG PO TABS: 20.0000 mg | ORAL_TABLET | Freq: Every day | ORAL | 4 refills | Status: DC

## 2019-06-10 ENCOUNTER — Other Ambulatory Visit: Payer: 59

## 2019-06-11 ENCOUNTER — Ambulatory Visit: Payer: 59 | Attending: Internal Medicine

## 2019-06-11 DIAGNOSIS — Z20822 Contact with and (suspected) exposure to covid-19: Secondary | ICD-10-CM

## 2019-06-13 LAB — NOVEL CORONAVIRUS, NAA: SARS-CoV-2, NAA: NOT DETECTED

## 2020-04-04 ENCOUNTER — Other Ambulatory Visit: Payer: Self-pay | Admitting: Nurse Practitioner

## 2020-04-04 DIAGNOSIS — Z1231 Encounter for screening mammogram for malignant neoplasm of breast: Secondary | ICD-10-CM

## 2020-04-20 ENCOUNTER — Ambulatory Visit: Payer: 59 | Admitting: Gastroenterology

## 2020-05-09 ENCOUNTER — Ambulatory Visit
Admission: RE | Admit: 2020-05-09 | Discharge: 2020-05-09 | Disposition: A | Discharge status: Home / Self Care | Payer: 59 | Source: Ambulatory Visit | Attending: Nurse Practitioner | Admitting: Nurse Practitioner

## 2020-05-09 ENCOUNTER — Other Ambulatory Visit: Payer: Self-pay

## 2020-05-09 DIAGNOSIS — Z1231 Encounter for screening mammogram for malignant neoplasm of breast: Secondary | ICD-10-CM | POA: Insufficient documentation

## 2020-05-16 ENCOUNTER — Other Ambulatory Visit: Payer: Self-pay | Admitting: *Deleted

## 2020-05-16 ENCOUNTER — Inpatient Hospital Stay
Admission: RE | Admit: 2020-05-16 | Discharge: 2020-05-16 | Disposition: A | Discharge status: Home / Self Care | Payer: Self-pay | Source: Ambulatory Visit | Attending: *Deleted | Admitting: *Deleted

## 2020-05-16 DIAGNOSIS — Z1231 Encounter for screening mammogram for malignant neoplasm of breast: Secondary | ICD-10-CM

## 2020-08-29 ENCOUNTER — Ambulatory Visit (INDEPENDENT_AMBULATORY_CARE_PROVIDER_SITE_OTHER): Payer: 59 | Admitting: Gastroenterology

## 2020-08-29 ENCOUNTER — Other Ambulatory Visit: Payer: Self-pay

## 2020-08-29 ENCOUNTER — Encounter: Payer: Self-pay | Admitting: Gastroenterology

## 2020-08-29 VITALS — BP 124/85 | HR 82 | Temp 97.0°F | Ht 63.0 in | Wt 188.8 lb

## 2020-08-29 DIAGNOSIS — K5120 Ulcerative (chronic) proctitis without complications: Secondary | ICD-10-CM | POA: Diagnosis not present

## 2020-08-29 MED ORDER — CLENPIQ 10-3.5-12 MG-GM -GM/160ML PO SOLN: 320.0000 mL | ORAL | 0 refills | Status: DC

## 2020-08-29 NOTE — Progress Notes (Signed)
Gastroenterology Consultation  Referring Provider:     Danelle Berry, NP Primary Care Physician:  Danelle Berry, NP Primary Gastroenterologist:  Dr. Allen Norris     Reason for Consultation:     colitis        HPI:   BREAWNA MONTENEGRO is a 60 y.o. y/o female referred for consultation & management of lcolitis by Dr. Danelle Berry, NP.  This patient comes in today after being seen last month at [facility] by the nurse practitioner.  The history at that time showed:  INFLAMMATORY BOWEL DISEASE HISTORY:  Type : Ulcerative colitis Age/Date of Diagnosis: 1997-1998 by Dr Earlean Shawl Disease Extent - left sided Surgeries: none Complications: none Extra-intestinal Manifestations: none Last Hospitalization associated with IBD: none Last Flare:unable to remember due time elapsed since  TREATMENT HISTORY: 5-ASA: currently on lialda- has been on Asacol, sulfasalazine, Colazol, and Canasa( 2006 )n past Steroids: Prednisone remotely  ENDOSCOPIC HISTORY: 12/17/2017 colonoscopy for follow-up of adenomatous colon polyps and an ulcerative recto sigmoiditis. Results: 2 sessile diminutive polyps found in the ascending colon, internal hemorrhoids, and the rectal mucosa with some granularity. Biopsies obtained showed tubular adenoma, focal granular hyperplasia and benign lymphoid aggregate. There was negative dysplasia and malignancy and no active signs of colitis. Colonoscopy 11/30/2014: internal and external hemorrhoids, a localized area of mild granular mucosa in the distal rectum. Bowel movements are without diarrhea Colonoscopy 12/27/2008 for long-standing history of ulcerative proctitis: colon looked normal except for single polyp in the splenic flexure.The colon biopsies negative, and one splenic flexure polyp positive for tubular adenoma. Colonoscopy on 11/22/2004 by Dr. Kristen Loader for history of ulcerative colitis:acute proctitis consistent with chronic proctitis, pseudopolyps in the transverse  colon, and surveillance biopsies throughout the colon Colonoscopy on 08/19/2001 : rectum established erythema present vascular pattern absent her erosions present at granularity pleasant bleeding have sent ulcers absent activity level mild. Biopsy showing rectum with him colonic mucosa with chronic inflammation no dysplasia Colonoscopy 07/23/2000: ulcerative colitis suspected activity level quiescent pseudopolyps present multiple 1-2 mm mucosal tags without inflammation. Cecum activity level mild edematous erythematous. Terminal ileum no inflammation. Rectal activity is moderate bleeding at edematous granular superficial ulcers present. Positive hyperplastic colon polyp, terminal ileum small-bowel biopsy unremarkable, chronic active colitis in the very end   The patient was recommended to undergo a repeat colonoscopy at that time.  The patient has also been on Lialda for her ulcerative colitis. The patient reports that she cannot recall having a flare for some time.  She states that she was told to be on maintenance therapy despite having no sign of any chronic inflammation or any recent colonoscopy that showed acute inflammation. She denies any rectal bleeding or diarrhea. There is no report of any unexplained weight loss fevers chills nausea vomiting black stools or bloody stools.  Past Medical History:  Diagnosis Date  . Colitis   . Seasonal allergies     Past Surgical History:  Procedure Laterality Date  . ENDOMETRIAL ABLATION  04/2008   HER OPTION ABLATION  . HYSTEROSCOPY  [id number]   POLYP    Prior to Admission medications   Medication Sig Start Date End Date Taking? Authorizing Provider  Cholecalciferol (VITAMIN D3) 2000 units capsule Take 1 capsule by mouth daily.    [provider]  citalopram (CELEXA) 20 MG tablet Take 30 mg by mouth at bedtime. 07/20/20   [provider]  mesalamine (LIALDA) 1.2 G EC tablet Take by mouth daily with breakfast.  [provider]  Multiple Vitamin (MULTIVITAMIN) capsule Take 1 capsule by mouth daily.    [provider]  phentermine (ADIPEX-P) 37.5 MG tablet Take 37.5 mg by mouth every morning. 05/27/20   [provider]  rosuvastatin (CRESTOR) 20 MG tablet Take 20 mg by mouth at bedtime. 07/21/20   [provider]  valACYclovir (VALTREX) 500 MG tablet Take 1 tablet twice daily for 3-5 days as needed 02/09/19   Huel Cote, NP    Family History  Problem Relation Age of Onset  . Diabetes Mother   . Heart disease Mother        hardening of arteries with bypass  . Congestive Heart Failure Mother   . Hypertension Mother   . Pancreatitis Mother   . Hypertension Father   . Breast cancer Neg Hx      Social History   Tobacco Use  . Smoking status: Former Research scientist (life sciences)  . Smokeless tobacco: Never Used  Vaping Use  . Vaping Use: Never used  Substance Use Topics  . Alcohol use: Yes    Alcohol/week: 12.0 standard drinks    Types: 12 Cans of beer per week  . Drug use: No    Allergies as of 08/29/2020 - Review Complete 02/09/2019  Allergen Reaction Noted  . Metronidazole  04/20/2011    Review of Systems:    All systems reviewed and negative except where noted in HPI.   Physical Exam:  There were no vitals taken for this visit. No LMP recorded. Patient has had an ablation. General:   Alert,  Well-developed, well-nourished, pleasant and cooperative in NAD Head:  Normocephalic and atraumatic. Eyes:  Sclera clear, no icterus.   Conjunctiva pink. Ears:  Normal auditory acuity. Neck:  Supple; no masses or thyromegaly. Lungs:  Respirations even and unlabored.  Clear throughout to auscultation.   No wheezes, crackles, or rhonchi. No acute distress. Heart:  Regular rate and rhythm; no murmurs, clicks, rubs, or gallops. Abdomen:  Normal bowel sounds.  No bruits.  Soft, non-tender and non-distended without masses, hepatosplenomegaly or hernias noted.  No guarding or rebound  tenderness.  Negative Carnett sign.   Rectal:  Deferred.  Pulses:  Normal pulses noted. Extremities:  No clubbing or edema.  No cyanosis. Neurologic:  Alert and oriented x3;  grossly normal neurologically. Skin:  Intact without significant lesions or rashes.  No jaundice. Lymph Nodes:  No significant cervical adenopathy. Psych:  Alert and cooperative. Normal mood and affect.  Imaging Studies: No results found.  Assessment and Plan:   IYLA BALZARINI is a 60 y.o. y/o female who comes in today with a history of having to procedures that showed proctitis back in 2002 and 2003. The patient has had normal biopsies of the rectum since then and no signs of acute inflammation anywhere in the colon.  The patient has been told to come off of her Mesalamine since she has not had any symptoms for some time.  She has also been told that if her symptoms should come back she should She may consider starting Canasa suppositories since her inflammation has been isolated to  the rectum.  The patient will also be set up for a colonoscopy due to her questionable history of inflammatory bowel disease and polyps that were adenomatous found that her previous 2 colonoscopies.  The patient has been explained the plan and agrees with it.    Lucilla Lame, MD. Marval Regal    Note: This dictation was prepared with Dragon dictation  along with smaller phrase technology. Any transcriptional errors that result from this process are unintentional.

## 2020-08-30 ENCOUNTER — Other Ambulatory Visit: Payer: Self-pay

## 2020-08-30 DIAGNOSIS — Z86010 Personal history of colonic polyps: Secondary | ICD-10-CM

## 2020-08-30 MED ORDER — SUPREP BOWEL PREP KIT 17.5-3.13-1.6 GM/177ML PO SOLN: 1.0000 | ORAL | 0 refills | Status: DC

## 2020-09-08 ENCOUNTER — Other Ambulatory Visit: Payer: Self-pay

## 2020-09-08 ENCOUNTER — Encounter: Payer: Self-pay | Admitting: Gastroenterology

## 2020-09-14 ENCOUNTER — Other Ambulatory Visit
Admission: RE | Admit: 2020-09-14 | Discharge: 2020-09-14 | Disposition: A | Discharge status: Home / Self Care | Payer: 59 | Source: Ambulatory Visit | Attending: Gastroenterology | Admitting: Gastroenterology

## 2020-09-14 ENCOUNTER — Other Ambulatory Visit: Payer: Self-pay

## 2020-09-14 DIAGNOSIS — Z01812 Encounter for preprocedural laboratory examination: Secondary | ICD-10-CM | POA: Diagnosis not present

## 2020-09-14 DIAGNOSIS — U071 COVID-19: Secondary | ICD-10-CM | POA: Insufficient documentation

## 2020-09-14 HISTORY — DX: COVID-19: U07.1

## 2020-09-14 LAB — SARS CORONAVIRUS 2 (TAT 6-24 HRS): SARS Coronavirus 2: POSITIVE — AB

## 2020-09-15 ENCOUNTER — Telehealth: Payer: Self-pay

## 2020-09-15 NOTE — Telephone Encounter (Signed)
Called to discuss with patient about COVID-19 symptoms and the use of one of the available treatments for those with mild to moderate Covid symptoms and at a high risk of hospitalization.  Pt appears to qualify for outpatient treatment due to co-morbid conditions and/or a member of an at-risk group in accordance with the FDA Emergency Use Authorization.    Symptom onset: Unknown Vaccinated: Yes Booster? Unknown Immunocompromised? No Qualifiers: Colitis  Unable to reach pt - Left message and call back number [phone].  Marcello Moores

## 2020-09-15 NOTE — Telephone Encounter (Signed)
-----   Message from Michelle Lame, MD sent at 09/15/2020  7:32 AM EDT ----- Let this patient know her covid test is positive. It appears her procedure is on 5/6

## 2020-09-15 NOTE — Telephone Encounter (Signed)
Pt has already been made aware of her positive COVID test. Procedure has been moved to May 6th.

## 2020-10-12 ENCOUNTER — Encounter: Payer: Self-pay | Admitting: Gastroenterology

## 2020-10-12 ENCOUNTER — Other Ambulatory Visit: Payer: Self-pay

## 2020-10-20 NOTE — Discharge Instructions (Signed)

## 2020-10-21 ENCOUNTER — Other Ambulatory Visit: Payer: Self-pay

## 2020-10-21 ENCOUNTER — Encounter: Payer: Self-pay | Admitting: Gastroenterology

## 2020-10-21 ENCOUNTER — Ambulatory Visit: Payer: 59 | Admitting: Anesthesiology

## 2020-10-21 ENCOUNTER — Ambulatory Visit
Admission: RE | Admit: 2020-10-21 | Discharge: 2020-10-21 | Disposition: A | Discharge status: Home / Self Care | Payer: 59 | Source: Ambulatory Visit | Attending: Gastroenterology | Admitting: Gastroenterology

## 2020-10-21 ENCOUNTER — Ambulatory Visit
Admission: RE | Disposition: A | Discharge status: Home / Self Care | Payer: Self-pay | Source: Ambulatory Visit | Attending: Gastroenterology

## 2020-10-21 DIAGNOSIS — Z8379 Family history of other diseases of the digestive system: Secondary | ICD-10-CM | POA: Insufficient documentation

## 2020-10-21 DIAGNOSIS — Z1211 Encounter for screening for malignant neoplasm of colon: Secondary | ICD-10-CM | POA: Diagnosis not present

## 2020-10-21 DIAGNOSIS — Z8616 Personal history of COVID-19: Secondary | ICD-10-CM | POA: Diagnosis not present

## 2020-10-21 DIAGNOSIS — Z79899 Other long term (current) drug therapy: Secondary | ICD-10-CM | POA: Insufficient documentation

## 2020-10-21 DIAGNOSIS — Z87891 Personal history of nicotine dependence: Secondary | ICD-10-CM | POA: Diagnosis not present

## 2020-10-21 DIAGNOSIS — Z860100 Personal history of colon polyps, unspecified: Secondary | ICD-10-CM

## 2020-10-21 DIAGNOSIS — K640 First degree hemorrhoids: Secondary | ICD-10-CM | POA: Diagnosis not present

## 2020-10-21 DIAGNOSIS — Z8249 Family history of ischemic heart disease and other diseases of the circulatory system: Secondary | ICD-10-CM | POA: Diagnosis not present

## 2020-10-21 DIAGNOSIS — Z888 Allergy status to other drugs, medicaments and biological substances status: Secondary | ICD-10-CM | POA: Insufficient documentation

## 2020-10-21 DIAGNOSIS — Z833 Family history of diabetes mellitus: Secondary | ICD-10-CM | POA: Insufficient documentation

## 2020-10-21 HISTORY — PX: COLONOSCOPY WITH PROPOFOL: SHX5780

## 2020-10-21 HISTORY — DX: Unspecified osteoarthritis, unspecified site: M19.90

## 2020-10-21 SURGERY — COLONOSCOPY WITH PROPOFOL
Anesthesia: General

## 2020-10-21 MED ORDER — PROPOFOL 10 MG/ML IV BOLUS
INTRAVENOUS | Status: DC | PRN
  Administered 2020-10-21 (×3): 40 mg via INTRAVENOUS
  Administered 2020-10-21 (×2): 20 mg via INTRAVENOUS
  Administered 2020-10-21: 100 mg via INTRAVENOUS

## 2020-10-21 MED ORDER — LACTATED RINGERS IV SOLN: INTRAVENOUS | Status: DC

## 2020-10-21 MED ORDER — SODIUM CHLORIDE 0.9 % IV SOLN: INTRAVENOUS | Status: DC

## 2020-10-21 MED ORDER — LIDOCAINE HCL (CARDIAC) PF 100 MG/5ML IV SOSY
PREFILLED_SYRINGE | INTRAVENOUS | Status: DC | PRN
  Administered 2020-10-21: 20 mg via INTRAVENOUS

## 2020-10-21 MED ORDER — ACETAMINOPHEN 160 MG/5ML PO SOLN: 325.0000 mg | ORAL | Status: DC | PRN

## 2020-10-21 MED ORDER — STERILE WATER FOR IRRIGATION IR SOLN: Status: DC | PRN

## 2020-10-21 MED ORDER — ACETAMINOPHEN 325 MG PO TABS
325.0000 mg | ORAL_TABLET | ORAL | Status: DC | PRN
Start: 2020-10-21 — End: 2020-10-21

## 2020-10-21 SURGICAL SUPPLY — 22 items

## 2020-10-21 NOTE — H&P (Signed)
Lucilla Lame, MD [facility] [address] [place], [place] [address] Phone:[phone] Fax : [phone]  Primary Care Physician:  Danelle Berry, NP Primary Gastroenterologist:  Dr. Allen Norris  Pre-Procedure History & Physical: HPI:  Michelle Huynh is a 60 y.o. female is here for an colonoscopy.   Past Medical History:  Diagnosis Date  . Arthritis    thumbs  . Colitis   . COVID-19 09/14/2020   "allergy" type symptoms.  resolved  . Seasonal allergies     Past Surgical History:  Procedure Laterality Date  . COLONOSCOPY    . ENDOMETRIAL ABLATION  04/2008   HER OPTION ABLATION  . HYSTEROSCOPY  [id number]   POLYP    Prior to Admission medications   Medication Sig Start Date End Date Taking? Authorizing Provider  Cholecalciferol (VITAMIN D3) 2000 units capsule Take 1 capsule by mouth daily.   Yes [provider]  citalopram (CELEXA) 20 MG tablet Take 30 mg by mouth at bedtime. 07/20/20  Yes [provider]  Multiple Vitamin (MULTIVITAMIN) capsule Take 1 capsule by mouth daily.   Yes [provider]  phentermine (ADIPEX-P) 37.5 MG tablet Take 37.5 mg by mouth as needed. 05/27/20  Yes [provider]  rosuvastatin (CRESTOR) 20 MG tablet Take 20 mg by mouth at bedtime. 07/21/20  Yes [provider]  valACYclovir (VALTREX) 500 MG tablet Take 1 tablet twice daily for 3-5 days as needed 02/09/19  Yes Young, Candiss Norse, NP  mesalamine (LIALDA) 1.2 G EC tablet Take by mouth daily with breakfast. Patient not taking: Reported on 09/08/2020    [provider]  Na Sulfate-K Sulfate-Mg Sulf (SUPREP BOWEL PREP KIT) 17.5-3.13-1.6 GM/177ML SOLN Take 1 kit by mouth as directed. 08/30/20   Lucilla Lame, MD    Allergies as of 08/30/2020 - Review Complete 08/29/2020  Allergen Reaction Noted  . Metronidazole  04/20/2011    Family History  Problem Relation Age of Onset  . Diabetes Mother   . Heart disease Mother        hardening of arteries with  bypass  . Congestive Heart Failure Mother   . Hypertension Mother   . Pancreatitis Mother   . Hypertension Father   . Breast cancer Neg Hx     Social History   Socioeconomic History  . Marital status: Married    Spouse name: Not on file  . Number of children: Not on file  . Years of education: Not on file  . Highest education level: Not on file  Occupational History  . Not on file  Tobacco Use  . Smoking status: Former Smoker    Packs/day: 1.50    Years: 20.00    Pack years: 30.00    Types: Cigarettes    Quit date: 09/09/2015    Years since quitting: 5.1  . Smokeless tobacco: Never Used  Vaping Use  . Vaping Use: Never used  Substance and Sexual Activity  . Alcohol use: Yes    Alcohol/week: 10.0 - 15.0 standard drinks    Types: 10 - 15 Glasses of wine per week  . Drug use: No  . Sexual activity: Yes    Birth control/protection: None    Comment: ABLATION, INTERCOURSE AGE 22, SEXUAL PARTNERS LESS THAN 5  Other Topics Concern  . Not on file  Social History Narrative  . Not on file   Social Determinants of Health   Financial Resource Strain: Not on file  Food Insecurity: Not on file  Transportation Needs:  Not on file  Physical Activity: Not on file  Stress: Not on file  Social Connections: Not on file  Intimate Partner Violence: Not on file    Review of Systems: See HPI, otherwise negative ROS  Physical Exam: BP 121/72   Pulse 68   Temp 97.8 F (36.6 C) (Temporal)   Ht _0  (1.6 m)   Wt 83 kg   SpO2 95%   BMI 32.42 kg/m  General:   Alert,  pleasant and cooperative in NAD Head:  Normocephalic and atraumatic. Neck:  Supple; no masses or thyromegaly. Lungs:  Clear throughout to auscultation.    Heart:  Regular rate and rhythm. Abdomen:  Soft, nontender and nondistended. Normal bowel sounds, without guarding, and without rebound.   Neurologic:  Alert and  oriented x4;  grossly normal neurologically.  Impression/Plan: Michelle Huynh is here for an  colonoscopy to be performed for a history of adenomatous polyps 3 years ago    Risks, benefits, limitations, and alternatives regarding  colonoscopy have been reviewed with the patient.  Questions have been answered.  All parties agreeable.   Lucilla Lame, MD  10/21/2020, 8:24 AM

## 2020-10-21 NOTE — Op Note (Signed)
[facility] Patient Name: Michelle Huynh Procedure Date: 10/21/2020 9:13 AM MRN: [id number] Account #: [id number] Date of Birth: 30-Jul-1960 Admit Type: Outpatient Age: 60 Room: [facility] OR ROOM 01 Gender: Female Note Status: Finalized Procedure:             Colonoscopy Indications:           High risk colon cancer surveillance: Personal history                         of colonic polyps Providers:             Lucilla Lame MD, MD Referring MD:          Maryellen Pile (Referring MD) Medicines:             Propofol per Anesthesia Complications:         No immediate complications. Procedure:             Pre-Anesthesia Assessment:                        - Prior to the procedure, a History and Physical was                         performed, and patient medications and allergies were                         reviewed. The patient's tolerance of previous                         anesthesia was also reviewed. The risks and benefits                         of the procedure and the sedation options and risks                         were discussed with the patient. All questions were                         answered, and informed consent was obtained. Prior                         Anticoagulants: The patient has taken no previous                         anticoagulant or antiplatelet agents. ASA Grade                         Assessment: II - A patient with mild systemic disease.                         After reviewing the risks and benefits, the patient                         was deemed in satisfactory condition to undergo the                         procedure.  After obtaining informed consent, the colonoscope was                         passed under direct vision. Throughout the procedure,                         the patient's blood pressure, pulse, and oxygen                         saturations were monitored continuously. The was                          introduced through the anus and advanced to the the                         cecum, identified by appendiceal orifice and ileocecal                         valve. The colonoscopy was performed without                         difficulty. The patient tolerated the procedure well.                         The quality of the bowel preparation was excellent. Findings:      The perianal and digital rectal examinations were normal.      Non-bleeding internal hemorrhoids were found during retroflexion. The       hemorrhoids were Grade I (internal hemorrhoids that do not prolapse). Impression:            - Non-bleeding internal hemorrhoids.                        - No specimens collected. Recommendation:        - Discharge patient to home.                        - Resume previous diet.                        - Continue present medications.                        - Repeat colonoscopy in 7 years for surveillance. Procedure Code(s):     --- Professional ---                        [phone], Colonoscopy, flexible; diagnostic, including                         collection of specimen(s) by brushing or washing, when                         performed (separate procedure) Diagnosis Code(s):     --- Professional ---                        Z86.010, Personal history of colonic polyps CPT copyright 2019 American Medical Association. All rights reserved. The codes documented in this report are preliminary and upon coder review may  be revised  to meet current compliance requirements. Lucilla Lame MD, MD 10/21/2020 9:35:04 AM This report has been signed electronically. Number of Addenda: 0 Note Initiated On: 10/21/2020 9:13 AM Scope Withdrawal Time: 0 hours 7 minutes 33 seconds  Total Procedure Duration: 0 hours 12 minutes 27 seconds  Estimated Blood Loss:  Estimated blood loss: none.      [facility]

## 2020-10-21 NOTE — Anesthesia Preprocedure Evaluation (Signed)
Anesthesia Evaluation  Patient identified by MRN, date of birth, ID band Patient awake    Reviewed: Allergy & Precautions, H&P , NPO status , Patient's Chart, lab work & pertinent test results, reviewed documented beta blocker date and time   Airway Mallampati: II  TM Distance: >3 FB Neck ROM: full    Dental no notable dental hx.    Pulmonary neg pulmonary ROS, former smoker,    Pulmonary exam normal breath sounds clear to auscultation       Cardiovascular Exercise Tolerance: Good negative cardio ROS Normal cardiovascular exam Rhythm:regular Rate:Normal     Neuro/Psych negative neurological ROS  negative psych ROS   GI/Hepatic negative GI ROS, Neg liver ROS,   Endo/Other  negative endocrine ROS  Renal/GU negative Renal ROS  negative genitourinary   Musculoskeletal   Abdominal   Peds  Hematology negative hematology ROS (+)   Anesthesia Other Findings Arthritis  Reproductive/Obstetrics negative OB ROS                             Anesthesia Physical Anesthesia Plan  ASA: II  Anesthesia Plan: General   Post-op Pain Management:    Induction:   PONV Risk Score and Plan:   Airway Management Planned:   Additional Equipment:   Intra-op Plan:   Post-operative Plan:   Informed Consent: I have reviewed the patients History and Physical, chart, labs and discussed the procedure including the risks, benefits and alternatives for the proposed anesthesia with the patient or authorized representative who has indicated his/her understanding and acceptance.     Dental Advisory Given  Plan Discussed with: CRNA and Anesthesiologist  Anesthesia Plan Comments:         Anesthesia Quick Evaluation

## 2020-10-21 NOTE — Anesthesia Postprocedure Evaluation (Signed)
Anesthesia Post Note  Patient: Michelle Huynh  Procedure(s) Performed: COLONOSCOPY WITH PROPOFOL (N/A )     Patient location during evaluation: PACU Anesthesia Type: General Level of consciousness: awake and alert Pain management: pain level controlled Vital Signs Assessment: post-procedure vital signs reviewed and stable Respiratory status: spontaneous breathing, nonlabored ventilation, respiratory function stable and patient connected to nasal cannula oxygen Cardiovascular status: blood pressure returned to baseline and stable Postop Assessment: no apparent nausea or vomiting Anesthetic complications: no   No complications documented.  Trecia Rogers

## 2020-10-21 NOTE — Transfer of Care (Signed)
Immediate Anesthesia Transfer of Care Note  Patient: Michelle Huynh  Procedure(s) Performed: COLONOSCOPY WITH PROPOFOL (N/A )  Patient Location: PACU  Anesthesia Type: General  Level of Consciousness: awake, alert  and patient cooperative  Airway and Oxygen Therapy: Patient Spontanous Breathing   Post-op Assessment: Post-op Vital signs reviewed, Patient's Cardiovascular Status Stable, Respiratory Function Stable, Patent Airway and No signs of Nausea or vomiting  Post-op Vital Signs: Reviewed and stable  Complications: No complications documented.

## 2020-10-21 NOTE — Anesthesia Procedure Notes (Signed)
Procedure Name: MAC Date/Time: 10/21/2020 9:19 AM Performed by: Vanetta Shawl, CRNA Pre-anesthesia Checklist: Patient identified, Emergency Drugs available, Suction available, Timeout performed and Patient being monitored Patient Re-evaluated:Patient Re-evaluated prior to induction Oxygen Delivery Method: Nasal cannula Placement Confirmation: positive ETCO2

## 2020-10-24 ENCOUNTER — Encounter: Payer: Self-pay | Admitting: Gastroenterology

## 2021-05-01 ENCOUNTER — Ambulatory Visit
Admission: RE | Admit: 2021-05-01 | Discharge: 2021-05-01 | Disposition: A | Discharge status: Home / Self Care | Payer: 59 | Attending: Nurse Practitioner | Admitting: Nurse Practitioner

## 2021-05-01 ENCOUNTER — Ambulatory Visit
Admission: RE | Admit: 2021-05-01 | Discharge: 2021-05-01 | Disposition: A | Discharge status: Home / Self Care | Payer: 59 | Source: Ambulatory Visit | Attending: Nurse Practitioner | Admitting: Nurse Practitioner

## 2021-05-01 ENCOUNTER — Other Ambulatory Visit: Payer: Self-pay | Admitting: Nurse Practitioner

## 2021-05-01 ENCOUNTER — Other Ambulatory Visit: Payer: Self-pay

## 2021-05-01 DIAGNOSIS — R52 Pain, unspecified: Secondary | ICD-10-CM | POA: Diagnosis not present

## 2021-05-02 ENCOUNTER — Other Ambulatory Visit: Payer: Self-pay | Admitting: Nurse Practitioner

## 2021-05-02 DIAGNOSIS — Z1231 Encounter for screening mammogram for malignant neoplasm of breast: Secondary | ICD-10-CM

## 2021-05-18 ENCOUNTER — Ambulatory Visit
Admission: RE | Admit: 2021-05-18 | Discharge: 2021-05-18 | Disposition: A | Discharge status: Home / Self Care | Payer: 59 | Source: Ambulatory Visit | Attending: Nurse Practitioner | Admitting: Nurse Practitioner

## 2021-05-18 ENCOUNTER — Other Ambulatory Visit: Payer: Self-pay

## 2021-05-18 DIAGNOSIS — Z1231 Encounter for screening mammogram for malignant neoplasm of breast: Secondary | ICD-10-CM | POA: Diagnosis present

## 2022-02-01 ENCOUNTER — Encounter (INDEPENDENT_AMBULATORY_CARE_PROVIDER_SITE_OTHER): Payer: 59 | Admitting: Ophthalmology

## 2022-02-01 DIAGNOSIS — H3561 Retinal hemorrhage, right eye: Secondary | ICD-10-CM | POA: Diagnosis not present

## 2022-02-01 DIAGNOSIS — H2513 Age-related nuclear cataract, bilateral: Secondary | ICD-10-CM | POA: Diagnosis not present

## 2022-02-01 DIAGNOSIS — D3132 Benign neoplasm of left choroid: Secondary | ICD-10-CM

## 2022-02-01 DIAGNOSIS — H43811 Vitreous degeneration, right eye: Secondary | ICD-10-CM

## 2022-03-05 ENCOUNTER — Other Ambulatory Visit: Payer: Self-pay | Admitting: Internal Medicine

## 2022-03-05 DIAGNOSIS — I2510 Atherosclerotic heart disease of native coronary artery without angina pectoris: Secondary | ICD-10-CM

## 2022-03-13 ENCOUNTER — Other Ambulatory Visit: Payer: Self-pay | Admitting: Internal Medicine

## 2022-03-13 ENCOUNTER — Encounter (HOSPITAL_COMMUNITY): Payer: Self-pay

## 2022-03-13 ENCOUNTER — Telehealth (HOSPITAL_COMMUNITY): Payer: Self-pay | Admitting: *Deleted

## 2022-03-13 ENCOUNTER — Ambulatory Visit
Admission: RE | Admit: 2022-03-13 | Discharge: 2022-03-13 | Disposition: A | Discharge status: Home / Self Care | Payer: 59 | Source: Ambulatory Visit | Attending: Internal Medicine | Admitting: Internal Medicine

## 2022-03-13 DIAGNOSIS — R931 Abnormal findings on diagnostic imaging of heart and coronary circulation: Secondary | ICD-10-CM

## 2022-03-13 DIAGNOSIS — R079 Chest pain, unspecified: Secondary | ICD-10-CM | POA: Insufficient documentation

## 2022-03-13 MED ORDER — IVABRADINE HCL 5 MG PO TABS: ORAL_TABLET | ORAL | 0 refills | Status: DC

## 2022-03-13 MED ORDER — METOPROLOL TARTRATE 100 MG PO TABS: ORAL_TABLET | ORAL | 0 refills | Status: DC

## 2022-03-13 NOTE — Telephone Encounter (Signed)
Reaching out to patient to offer assistance regarding upcoming cardiac imaging study; pt verbalizes understanding of appt date/time, parking situation and where to check in,  medications ordered, and verified current allergies; name and call back number provided for further questions should they arise  Gordy Clement RN Navigator Cardiac [facility] [phone] office [phone] cell  Patient to take '100mg'$  metoprolol tartrate and '15mg'$  ivabradine TWO hours prior to her cardiac CT scan.

## 2022-03-15 ENCOUNTER — Encounter (INDEPENDENT_AMBULATORY_CARE_PROVIDER_SITE_OTHER): Payer: 59 | Admitting: Ophthalmology

## 2022-03-15 ENCOUNTER — Ambulatory Visit
Admission: RE | Admit: 2022-03-15 | Discharge: 2022-03-15 | Disposition: A | Discharge status: Home / Self Care | Payer: 59 | Source: Ambulatory Visit | Attending: Internal Medicine | Admitting: Internal Medicine

## 2022-03-15 DIAGNOSIS — I2510 Atherosclerotic heart disease of native coronary artery without angina pectoris: Secondary | ICD-10-CM | POA: Insufficient documentation

## 2022-03-15 DIAGNOSIS — R931 Abnormal findings on diagnostic imaging of heart and coronary circulation: Secondary | ICD-10-CM | POA: Diagnosis not present

## 2022-03-15 DIAGNOSIS — R079 Chest pain, unspecified: Secondary | ICD-10-CM | POA: Diagnosis not present

## 2022-03-15 LAB — POCT I-STAT CREATININE: Creatinine, Ser: 0.9 mg/dL (ref 0.44–1.00)

## 2022-03-15 MED ORDER — NITROGLYCERIN 0.4 MG SL SUBL
0.8000 mg | SUBLINGUAL_TABLET | Freq: Once | SUBLINGUAL | Status: AC
  Administered 2022-03-15: 0.8 mg via SUBLINGUAL

## 2022-03-15 MED ORDER — IOHEXOL 350 MG/ML SOLN
100.0000 mL | Freq: Once | INTRAVENOUS | Status: AC | PRN
  Administered 2022-03-15: 100 mL via INTRAVENOUS

## 2022-03-15 NOTE — Progress Notes (Signed)
Patient tolerated procedure well. Ambulate w/o difficulty. Denies any lightheadedness or being dizzy. Pt denies any pain at this time. Sitting in chair, pt drinking coffee, pt is encouraged to drink additional water throughout the day and reason explained to patient. Patient verbalized understanding and all questions answered. ABC intact. No further needs at this time. Discharge from procedure area w/o issues.

## 2022-04-04 ENCOUNTER — Other Ambulatory Visit: Payer: Self-pay | Admitting: Home Modifications

## 2022-04-04 DIAGNOSIS — E041 Nontoxic single thyroid nodule: Secondary | ICD-10-CM

## 2022-04-10 ENCOUNTER — Ambulatory Visit
Admission: RE | Admit: 2022-04-10 | Discharge: 2022-04-10 | Disposition: A | Discharge status: Home / Self Care | Payer: 59 | Source: Ambulatory Visit | Attending: Home Modifications | Admitting: Home Modifications

## 2022-04-10 DIAGNOSIS — E041 Nontoxic single thyroid nodule: Secondary | ICD-10-CM

## 2022-05-07 ENCOUNTER — Ambulatory Visit: Payer: 59 | Admitting: Nurse Practitioner

## 2022-08-12 IMAGING — MG MM DIGITAL SCREENING BILAT W/ TOMO AND CAD
8 series · 8 of 24 positions shown · non-contrast
Comparison: Previous exam(s).

CLINICAL DATA: Screening.

EXAM:
DIGITAL SCREENING BILATERAL MAMMOGRAM WITH TOMOSYNTHESIS AND CAD
TECHNIQUE: Bilateral screening digital craniocaudal and mediolateral oblique
mammograms were obtained. Bilateral screening digital breast
tomosynthesis was performed. The images were evaluated with
computer-aided detection.

[R CC synth-2D]
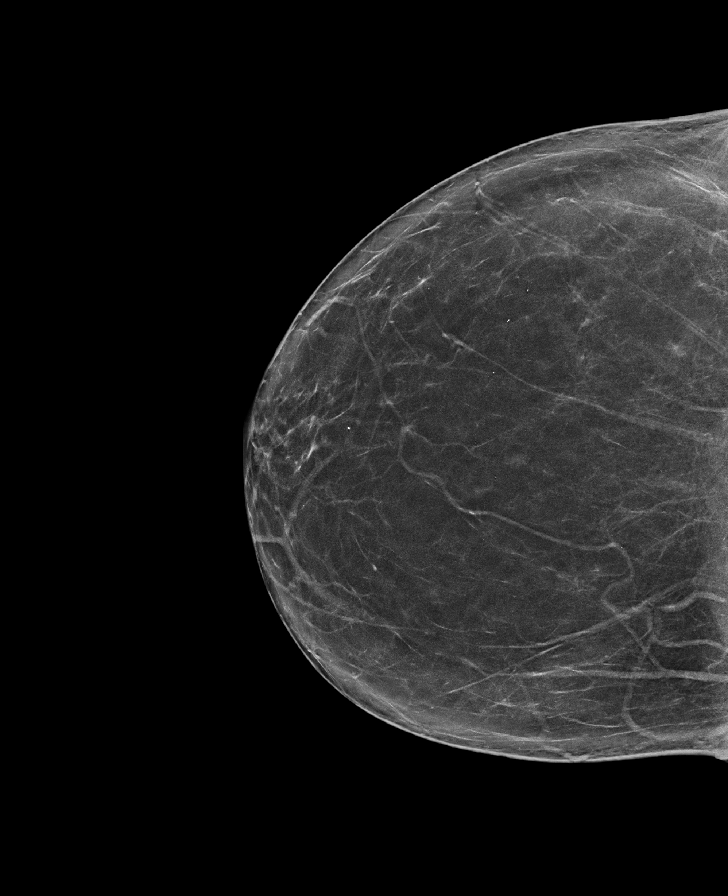

[L MLO synth-2D]
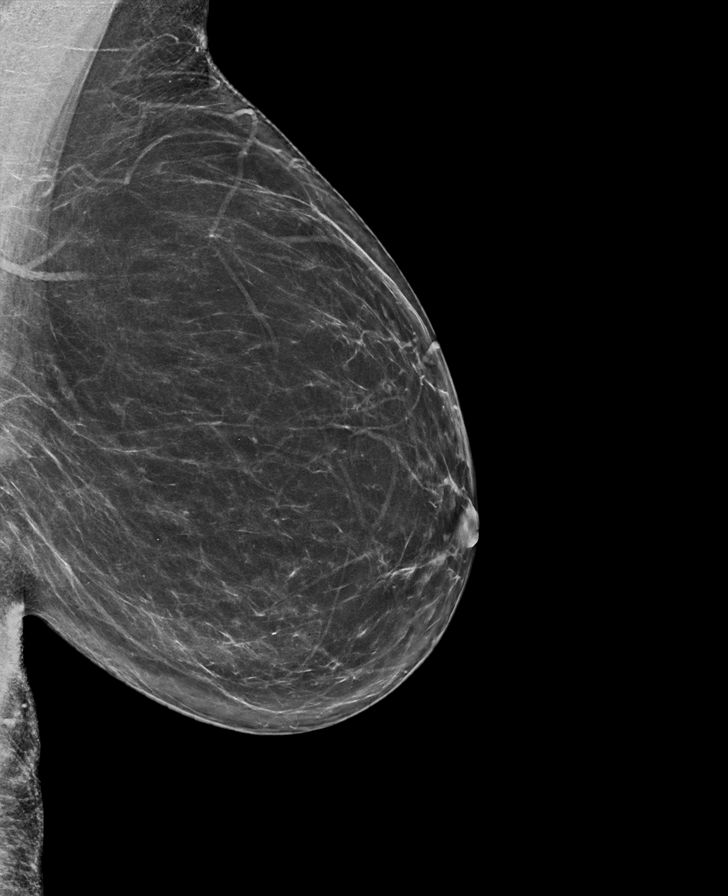

[L CC synth-2D]
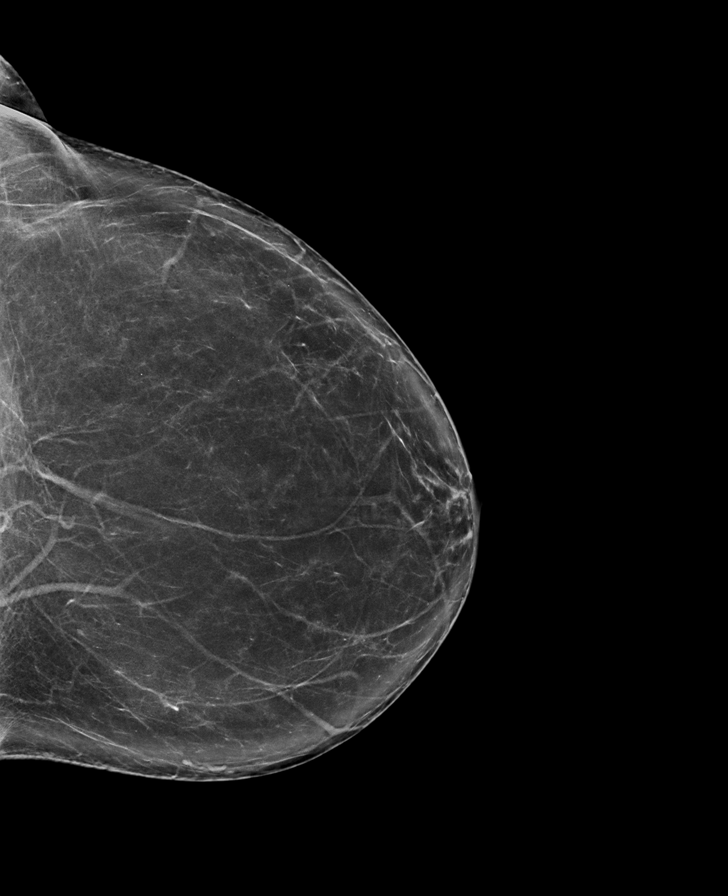

[R MLO synth-2D]
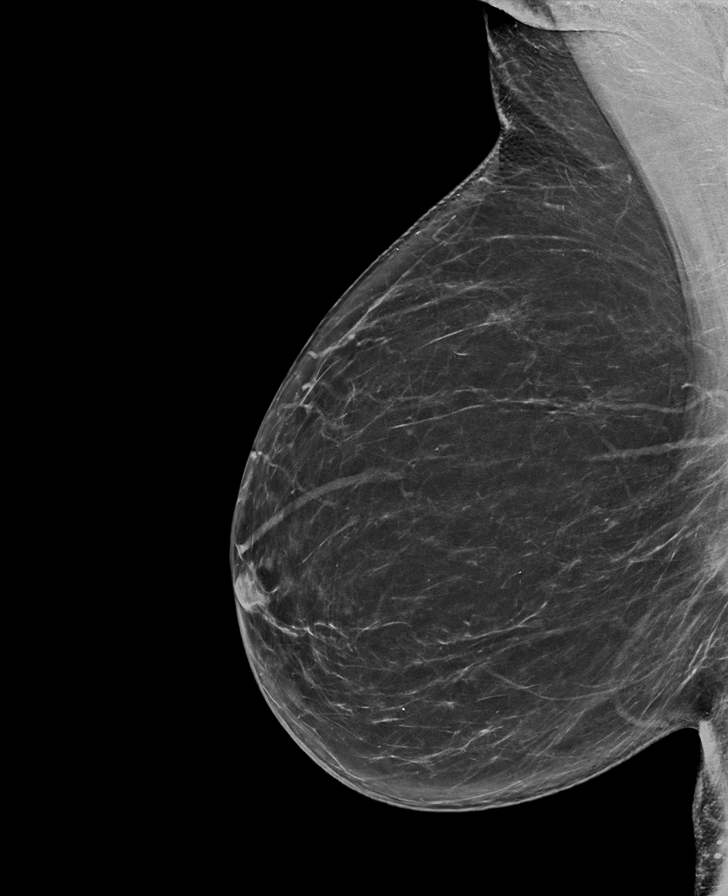

[L CC tomo · tomo slice 41/81.0]
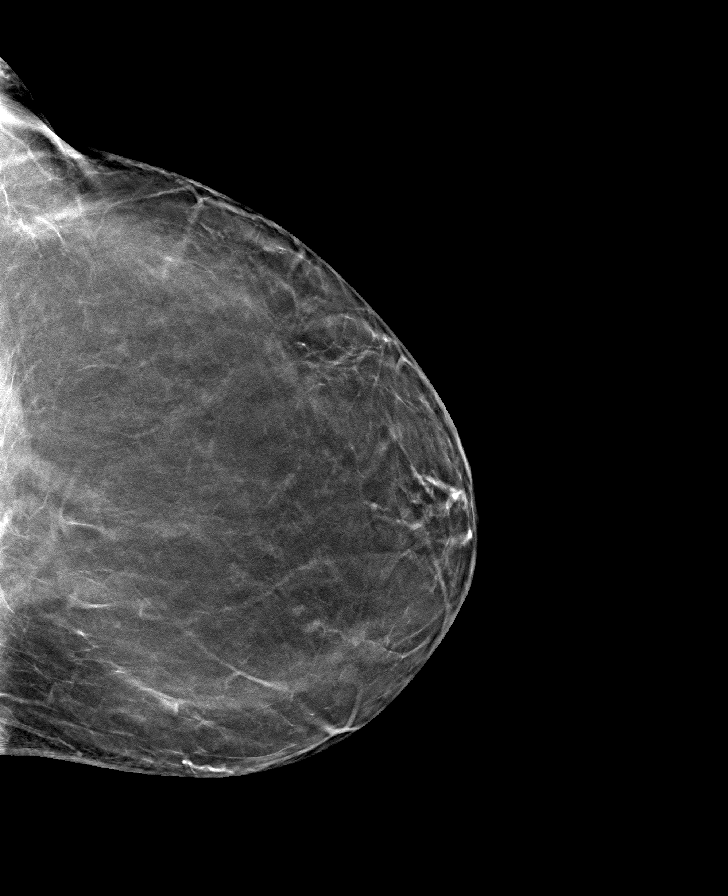

[L MLO tomo · tomo slice 39/77.0]
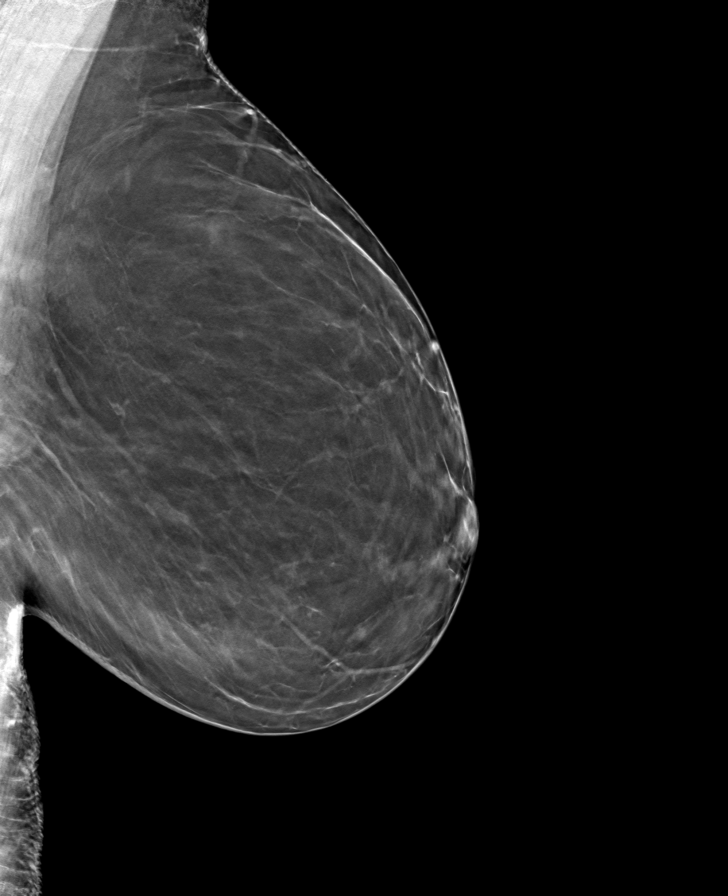

[R MLO tomo · tomo slice 40/79.0]
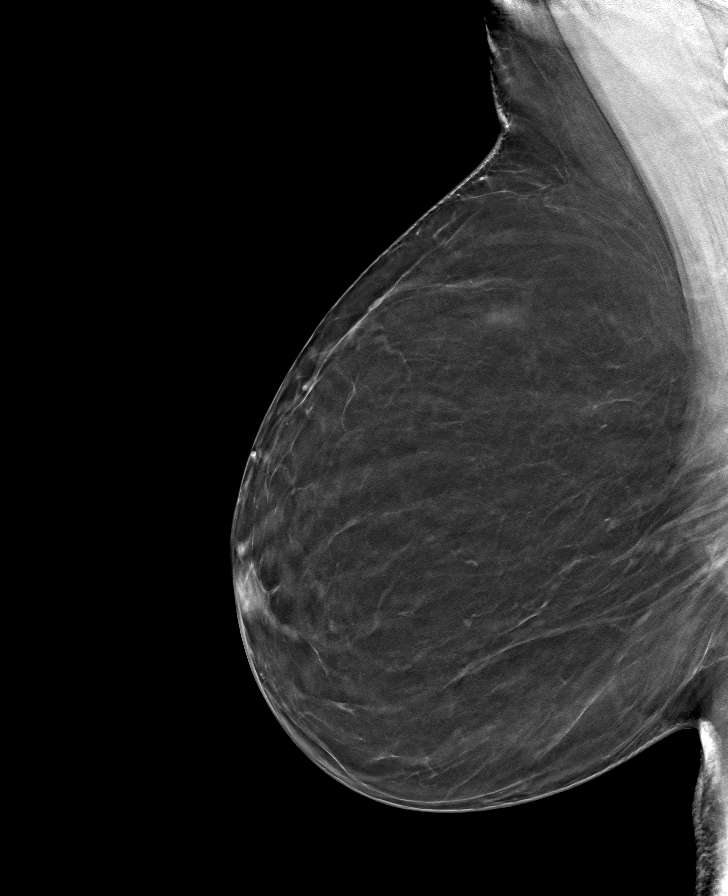

[R CC tomo · tomo slice 39/77.0]
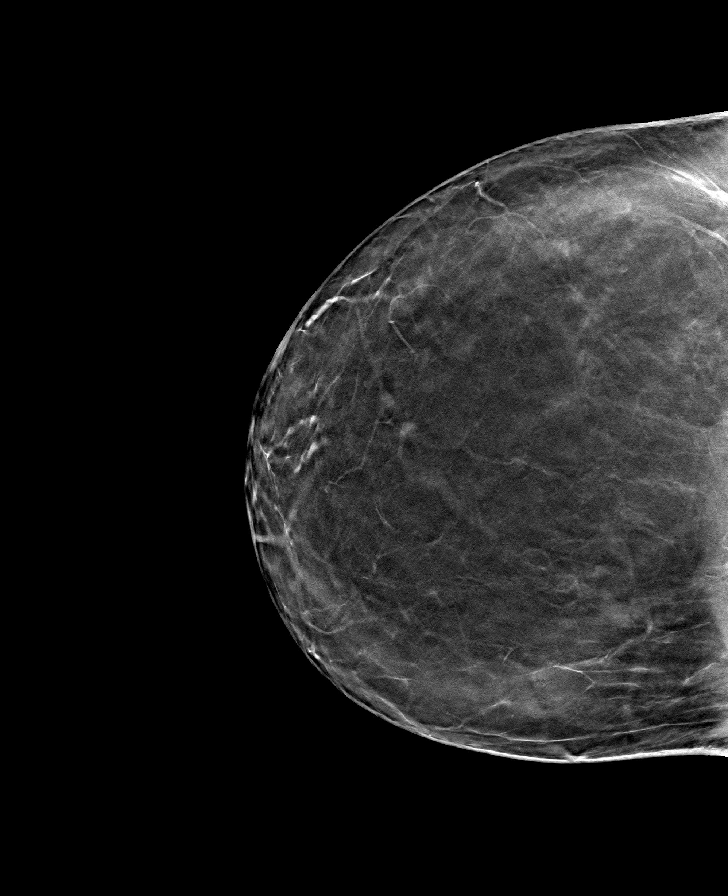

[8 of 24 positions shown; findings below may reference images not displayed]

ACR Breast Density Category b: There are scattered areas of
fibroglandular density.
FINDINGS: There are no findings suspicious for malignancy.
IMPRESSION: No mammographic evidence of malignancy. A result letter of this
screening mammogram will be mailed directly to the patient.

RECOMMENDATION:
Screening mammogram in one year. (Code:51-O-LD2)

BI-RADS CATEGORY  1: Negative.

## 2022-11-02 ENCOUNTER — Ambulatory Visit: Payer: 59 | Admitting: Nurse Practitioner

## 2022-11-21 ENCOUNTER — Other Ambulatory Visit: Payer: Self-pay | Admitting: Nurse Practitioner

## 2022-11-21 ENCOUNTER — Other Ambulatory Visit: Payer: 59

## 2022-11-22 LAB — COMPREHENSIVE METABOLIC PANEL
ALT: 20 IU/L (ref 0–32)
AST: 20 IU/L (ref 0–40)
Albumin/Globulin Ratio: 2 (ref 1.2–2.2)
Albumin: 4.3 g/dL (ref 3.9–4.9)
Alkaline Phosphatase: 80 IU/L (ref 44–121)
BUN/Creatinine Ratio: 15 (ref 12–28)
BUN: 15 mg/dL (ref 8–27)
Bilirubin Total: 0.5 mg/dL (ref 0.0–1.2)
CO2: 25 mmol/L (ref 20–29)
Calcium: 9.3 mg/dL (ref 8.7–10.3)
Chloride: 102 mmol/L (ref 96–106)
Creatinine, Ser: 1.02 mg/dL — ABNORMAL HIGH (ref 0.57–1.00)
Globulin, Total: 2.2 g/dL (ref 1.5–4.5)
Glucose: 88 mg/dL (ref 70–99)
Potassium: 4.4 mmol/L (ref 3.5–5.2)
Sodium: 139 mmol/L (ref 134–144)
Total Protein: 6.5 g/dL (ref 6.0–8.5)
eGFR: 62 mL/min/{1.73_m2} (ref >59)

## 2022-11-22 LAB — LIPID PANEL W/O CHOL/HDL RATIO
Cholesterol, Total: 142 mg/dL (ref 100–199)
HDL: 51 mg/dL (ref >39)
LDL Chol Calc (NIH): 74 mg/dL (ref 0–99)
Triglycerides: 91 mg/dL (ref 0–149)
VLDL Cholesterol Cal: 17 mg/dL (ref 5–40)

## 2022-11-22 LAB — CBC WITH DIFFERENTIAL/PLATELET
Basophils Absolute: 0 10*3/uL (ref 0.0–0.2)
Basos: 1 %
EOS (ABSOLUTE): 0.1 10*3/uL (ref 0.0–0.4)
Eos: 3 %
Hematocrit: 44.1 % (ref 34.0–46.6)
Hemoglobin: 14.7 g/dL (ref 11.1–15.9)
Immature Grans (Abs): 0 10*3/uL (ref 0.0–0.1)
Immature Granulocytes: 0 %
Lymphocytes Absolute: 1.4 10*3/uL (ref 0.7–3.1)
Lymphs: 29 %
MCH: 32.2 pg (ref 26.6–33.0)
MCHC: 33.3 g/dL (ref 31.5–35.7)
MCV: 97 fL (ref 79–97)
Monocytes Absolute: 0.4 10*3/uL (ref 0.1–0.9)
Monocytes: 8 %
Neutrophils Absolute: 2.8 10*3/uL (ref 1.4–7.0)
Neutrophils: 59 %
Platelets: 186 10*3/uL (ref 150–450)
RBC: 4.56 x10E6/uL (ref 3.77–5.28)
RDW: 13.1 % (ref 11.7–15.4)
WBC: 4.8 10*3/uL (ref 3.4–10.8)

## 2022-11-22 LAB — TSH: TSH: 0.803 u[IU]/mL (ref 0.450–4.500)

## 2022-11-22 LAB — HGB A1C W/O EAG: Hgb A1c MFr Bld: 5.6 % (ref 4.8–5.6)

## 2022-11-23 ENCOUNTER — Ambulatory Visit: Payer: 59 | Admitting: Nurse Practitioner

## 2022-11-30 ENCOUNTER — Ambulatory Visit: Payer: 59 | Admitting: Nurse Practitioner

## 2022-12-10 ENCOUNTER — Ambulatory Visit (INDEPENDENT_AMBULATORY_CARE_PROVIDER_SITE_OTHER): Payer: 59 | Admitting: Internal Medicine

## 2022-12-10 ENCOUNTER — Encounter: Payer: Self-pay | Admitting: Internal Medicine

## 2022-12-10 VITALS — BP 132/90 | HR 91 | Ht 63.0 in | Wt 192.4 lb

## 2022-12-10 DIAGNOSIS — K529 Noninfective gastroenteritis and colitis, unspecified: Secondary | ICD-10-CM

## 2022-12-10 DIAGNOSIS — Z13820 Encounter for screening for osteoporosis: Secondary | ICD-10-CM

## 2022-12-10 DIAGNOSIS — E782 Mixed hyperlipidemia: Secondary | ICD-10-CM | POA: Diagnosis not present

## 2022-12-10 DIAGNOSIS — F411 Generalized anxiety disorder: Secondary | ICD-10-CM

## 2022-12-10 DIAGNOSIS — E041 Nontoxic single thyroid nodule: Secondary | ICD-10-CM

## 2022-12-10 DIAGNOSIS — E66811 Obesity, class 1: Secondary | ICD-10-CM

## 2022-12-10 DIAGNOSIS — R030 Elevated blood-pressure reading, without diagnosis of hypertension: Secondary | ICD-10-CM | POA: Insufficient documentation

## 2022-12-10 DIAGNOSIS — E669 Obesity, unspecified: Secondary | ICD-10-CM | POA: Insufficient documentation

## 2022-12-10 MED ORDER — PHENTERMINE HCL 37.5 MG PO TABS: 37.5000 mg | ORAL_TABLET | ORAL | 2 refills | Status: DC | PRN

## 2022-12-10 NOTE — Progress Notes (Signed)
Established Patient Office Visit  Subjective:  Patient ID: Michelle Huynh, female    DOB: November 21, 1960  Age: 62 y.o. MRN: [id number]  Chief Complaint  Patient presents with   Follow-up    6 months with lab results    Patient in office for 6 month follow up, discuss fasting lab results. Patient doing well, no complaints. Last colonoscopy 2019. Mammogram 07/2022 done at [facility]. Patient due for a Dexa scan. Patient requesting a refill on the phentermine. Recent fasting labs. Hgb A1c no longer in pre-diabetes range.     No other concerns at this time.   Past Medical History:  Diagnosis Date   Arthritis    thumbs   Colitis    COVID-19 09/14/2020   "allergy" type symptoms.  resolved   Seasonal allergies     Past Surgical History:  Procedure Laterality Date   COLONOSCOPY     COLONOSCOPY WITH PROPOFOL N/A 10/21/2020   Procedure: COLONOSCOPY WITH PROPOFOL;  Surgeon: Midge Minium, MD;  [facility];  Service: Endoscopy;  Laterality: N/A;  COVID + 09-14-20   ENDOMETRIAL ABLATION  04/2008   HER OPTION ABLATION   HYSTEROSCOPY  [id number]   POLYP    Social History   Socioeconomic History   Marital status: Married    Spouse name: Not on file   Number of children: Not on file   Years of education: Not on file   Highest education level: Not on file  Occupational History   Not on file  Tobacco Use   Smoking status: Former    Packs/day: 1.50    Years: 20.00    Additional pack years: 0.00    Total pack years: 30.00    Types: Cigarettes    Quit date: 09/09/2015    Years since quitting: 7.2   Smokeless tobacco: Never  Vaping Use   Vaping Use: Never used  Substance and Sexual Activity   Alcohol use: Yes    Alcohol/week: 10.0 - 15.0 standard drinks of alcohol    Types: 10 - 15 Glasses of wine per week   Drug use: No   Sexual activity: Yes    Birth control/protection: None    Comment: ABLATION, INTERCOURSE AGE 64, SEXUAL PARTNERS LESS THAN 5  Other Topics Concern    Not on file  Social History Narrative   Not on file   Social Determinants of Health   Financial Resource Strain: Not on file  Food Insecurity: Not on file  Transportation Needs: Not on file  Physical Activity: Not on file  Stress: Not on file  Social Connections: Not on file  Intimate Partner Violence: Not on file    Family History  Problem Relation Age of Onset   Diabetes Mother    Heart disease Mother        hardening of arteries with bypass   Congestive Heart Failure Mother    Hypertension Mother    Pancreatitis Mother    Hypertension Father    Breast cancer Neg Hx     Allergies  Allergen Reactions   Codeine Nausea Only   Metronidazole     Review of Systems  Constitutional: Negative.   HENT: Negative.    Eyes: Negative.   Respiratory: Negative.  Negative for cough, shortness of breath and wheezing.   Cardiovascular: Negative.  Negative for chest pain, palpitations, orthopnea, claudication and leg swelling.  Gastrointestinal: Negative.  Negative for abdominal pain, constipation, diarrhea, heartburn, nausea and vomiting.  Genitourinary:  Negative for dysuria, flank  pain, frequency and hematuria.  Musculoskeletal:  Positive for joint pain. Negative for falls.  Skin: Negative.   Neurological:  Negative for dizziness, tingling, tremors, sensory change, speech change and headaches.  Endo/Heme/Allergies: Negative.   Psychiatric/Behavioral: Negative.  Negative for depression. The patient is not nervous/anxious.        Objective:   BP (!) 132/90   Pulse 91   Ht 5\' 3"  (1.6 m)   Wt 192 lb 6.4 oz (87.3 kg)   SpO2 96%   BMI 34.08 kg/m   Vitals:   12/10/22 1507  BP: (!) 132/90  Pulse: 91  Height: 5\' 3"  (1.6 m)  Weight: 192 lb 6.4 oz (87.3 kg)  SpO2: 96%  BMI (Calculated): 34.09    Physical Exam Vitals and nursing note reviewed.  Constitutional:      Appearance: Normal appearance.  HENT:     Head: Normocephalic and atraumatic.     Nose: Nose normal.   Cardiovascular:     Rate and Rhythm: Normal rate and regular rhythm.     Heart sounds: Normal heart sounds.  Pulmonary:     Effort: Pulmonary effort is normal.     Breath sounds: Normal breath sounds.  Abdominal:     Palpations: Abdomen is soft.  Musculoskeletal:        General: Normal range of motion.     Cervical back: Normal range of motion.  Skin:    General: Skin is warm and dry.  Neurological:     General: No focal deficit present.     Mental Status: She is alert and oriented to person, place, and time.  Psychiatric:        Mood and Affect: Mood normal.        Behavior: Behavior normal.      No results found for any visits on 12/10/22.  Recent Results (from the past 2160 hour(s))  CBC with Differential/Platelet     Status: None   Collection Time: 11/21/22  9:19 AM  Result Value Ref Range   WBC 4.8 3.4 - 10.8 x10E3/uL   RBC 4.56 3.77 - 5.28 x10E6/uL   Hemoglobin 14.7 11.1 - 15.9 g/dL   Hematocrit 14.7 82.9 - 46.6 %   MCV 97 79 - 97 fL   MCH 32.2 26.6 - 33.0 pg   MCHC 33.3 31.5 - 35.7 g/dL   RDW 56.2 13.0 - 86.5 %   Platelets 186 150 - 450 x10E3/uL   Neutrophils 59 Not Estab. %   Lymphs 29 Not Estab. %   Monocytes 8 Not Estab. %   Eos 3 Not Estab. %   Basos 1 Not Estab. %   Neutrophils Absolute 2.8 1.4 - 7.0 x10E3/uL   Lymphocytes Absolute 1.4 0.7 - 3.1 x10E3/uL   Monocytes Absolute 0.4 0.1 - 0.9 x10E3/uL   EOS (ABSOLUTE) 0.1 0.0 - 0.4 x10E3/uL   Basophils Absolute 0.0 0.0 - 0.2 x10E3/uL   Immature Granulocytes 0 Not Estab. %   Immature Grans (Abs) 0.0 0.0 - 0.1 x10E3/uL  Comprehensive metabolic panel     Status: Abnormal   Collection Time: 11/21/22  9:19 AM  Result Value Ref Range   Glucose 88 70 - 99 mg/dL   BUN 15 8 - 27 mg/dL   Creatinine, Ser 7.84 (H) 0.57 - 1.00 mg/dL   eGFR 62 >69 GE/XBM/8.41   BUN/Creatinine Ratio 15 12 - 28   Sodium 139 134 - 144 mmol/L   Potassium 4.4 3.5 - 5.2 mmol/L   Chloride 102 96 -  106 mmol/L   CO2 25 20 - 29 mmol/L    Calcium 9.3 8.7 - 10.3 mg/dL   Total Protein 6.5 6.0 - 8.5 g/dL   Albumin 4.3 3.9 - 4.9 g/dL   Globulin, Total 2.2 1.5 - 4.5 g/dL   Albumin/Globulin Ratio 2.0 1.2 - 2.2   Bilirubin Total 0.5 0.0 - 1.2 mg/dL   Alkaline Phosphatase 80 44 - 121 IU/L   AST 20 0 - 40 IU/L   ALT 20 0 - 32 IU/L  Lipid Panel w/o Chol/HDL Ratio     Status: None   Collection Time: 11/21/22  9:19 AM  Result Value Ref Range   Cholesterol, Total 142 100 - 199 mg/dL   Triglycerides 91 0 - 149 mg/dL   HDL 51 >11 mg/dL   VLDL Cholesterol Cal 17 5 - 40 mg/dL   LDL Chol Calc (NIH) 74 0 - 99 mg/dL  Hgb B1Y w/o eAG     Status: None   Collection Time: 11/21/22  9:19 AM  Result Value Ref Range   Hgb A1c MFr Bld 5.6 4.8 - 5.6 %    Comment:          Prediabetes: 5.7 - 6.4          Diabetes: >6.4          Glycemic control for adults with diabetes: <7.0   TSH     Status: None   Collection Time: 11/21/22  9:19 AM  Result Value Ref Range   TSH 0.803 0.450 - 4.500 uIU/mL      Assessment & Plan:  Dexa scan order placed. Phentermine refilled per patient request. Continue diet and exercise.  Needs to return sooner to check BP  and weight loss.  Problem List Items Addressed This Visit     Colitis   GAD (generalized anxiety disorder)   Thyroid nodule   Obesity (BMI 30.0-34.9)   Relevant Medications   phentermine (ADIPEX-P) 37.5 MG tablet   Mixed hyperlipidemia - Primary   Prehypertension   Other Visit Diagnoses     Screening for osteoporosis       Relevant Orders   DG Bone Density       Return in about 3 months (around 03/12/2023).   Total time spent: 30 minutes  Margaretann Loveless, MD  12/10/2022   This document may have been prepared by [facility] Voice Recognition software and as such may include unintentional dictation errors.

## 2022-12-19 ENCOUNTER — Other Ambulatory Visit: Payer: Self-pay | Admitting: Nurse Practitioner

## 2023-01-11 ENCOUNTER — Other Ambulatory Visit: Payer: Self-pay

## 2023-01-11 MED ORDER — AMOXICILLIN-POT CLAVULANATE 875-125 MG PO TABS: 1.0000 | ORAL_TABLET | Freq: Two times a day (BID) | ORAL | 0 refills | Status: DC

## 2023-03-12 ENCOUNTER — Other Ambulatory Visit: Payer: 59

## 2023-03-12 ENCOUNTER — Ambulatory Visit: Payer: 59 | Admitting: Cardiology

## 2023-03-26 ENCOUNTER — Other Ambulatory Visit: Payer: 59

## 2023-04-03 ENCOUNTER — Other Ambulatory Visit: Payer: Self-pay | Admitting: Nurse Practitioner

## 2023-04-03 DIAGNOSIS — E041 Nontoxic single thyroid nodule: Secondary | ICD-10-CM

## 2023-04-04 ENCOUNTER — Ambulatory Visit
Admission: RE | Admit: 2023-04-04 | Discharge: 2023-04-04 | Disposition: A | Discharge status: Home / Self Care | Payer: 59 | Source: Ambulatory Visit | Attending: Nurse Practitioner | Admitting: Nurse Practitioner

## 2023-04-04 DIAGNOSIS — E041 Nontoxic single thyroid nodule: Secondary | ICD-10-CM

## 2023-04-26 ENCOUNTER — Ambulatory Visit: Payer: 59 | Admitting: Internal Medicine

## 2023-04-30 ENCOUNTER — Ambulatory Visit (INDEPENDENT_AMBULATORY_CARE_PROVIDER_SITE_OTHER): Payer: 59

## 2023-04-30 DIAGNOSIS — M8589 Other specified disorders of bone density and structure, multiple sites: Secondary | ICD-10-CM | POA: Diagnosis not present

## 2023-04-30 DIAGNOSIS — Z13820 Encounter for screening for osteoporosis: Secondary | ICD-10-CM | POA: Diagnosis not present

## 2023-05-08 NOTE — Progress Notes (Signed)
Patient notified

## 2023-11-24 ENCOUNTER — Other Ambulatory Visit: Payer: Self-pay | Admitting: Family

## 2024-04-02 ENCOUNTER — Other Ambulatory Visit: Payer: Self-pay | Admitting: Nurse Practitioner

## 2024-04-02 DIAGNOSIS — E041 Nontoxic single thyroid nodule: Secondary | ICD-10-CM

## 2024-04-07 ENCOUNTER — Inpatient Hospital Stay: Admission: RE | Admit: 2024-04-07 | Discharge: 2024-04-07 | Attending: Nurse Practitioner

## 2024-04-07 DIAGNOSIS — E041 Nontoxic single thyroid nodule: Secondary | ICD-10-CM

## 2024-07-15 ENCOUNTER — Ambulatory Visit
Admission: RE | Admit: 2024-07-15 | Discharge: 2024-07-15 | Disposition: A | Source: Ambulatory Visit | Attending: General Practice | Admitting: General Practice

## 2024-07-15 ENCOUNTER — Encounter: Payer: Self-pay | Admitting: General Practice

## 2024-07-15 ENCOUNTER — Ambulatory Visit: Admitting: General Practice

## 2024-07-15 VITALS — BP 136/82 | HR 75 | Temp 98.0°F | Ht 63.5 in | Wt 189.0 lb

## 2024-07-15 DIAGNOSIS — G8929 Other chronic pain: Secondary | ICD-10-CM

## 2024-07-15 DIAGNOSIS — M546 Pain in thoracic spine: Secondary | ICD-10-CM

## 2024-07-15 DIAGNOSIS — E782 Mixed hyperlipidemia: Secondary | ICD-10-CM

## 2024-07-15 DIAGNOSIS — E041 Nontoxic single thyroid nodule: Secondary | ICD-10-CM | POA: Diagnosis not present

## 2024-07-15 DIAGNOSIS — E66811 Obesity, class 1: Secondary | ICD-10-CM | POA: Diagnosis not present

## 2024-07-15 DIAGNOSIS — Z7689 Persons encountering health services in other specified circumstances: Secondary | ICD-10-CM | POA: Insufficient documentation

## 2024-07-15 DIAGNOSIS — K6289 Other specified diseases of anus and rectum: Secondary | ICD-10-CM

## 2024-07-15 DIAGNOSIS — M180 Bilateral primary osteoarthritis of first carpometacarpal joints: Secondary | ICD-10-CM

## 2024-07-15 DIAGNOSIS — F411 Generalized anxiety disorder: Secondary | ICD-10-CM | POA: Diagnosis not present

## 2024-07-15 MED ORDER — ROSUVASTATIN CALCIUM 20 MG PO TABS: 10.0000 mg | ORAL_TABLET | Freq: Every day | ORAL | Status: AC

## 2024-07-15 MED ORDER — CYCLOBENZAPRINE HCL 5 MG PO TABS: 2.5000 mg | ORAL_TABLET | Freq: Every evening | ORAL | 0 refills | Status: AC | PRN

## 2024-07-15 NOTE — Assessment & Plan Note (Signed)
 Discussed the importance of healthy diet and exercise to affect sustainable weight loss.

## 2024-07-15 NOTE — Assessment & Plan Note (Signed)
 Diagnosed years ago.   Continue off meds.

## 2024-07-15 NOTE — Assessment & Plan Note (Signed)
 EMR reviewed briefly.

## 2024-07-15 NOTE — Progress Notes (Signed)
 "  New Patient Office Visit  Subjective    Patient ID: Michelle Huynh, female    DOB: Aug 10, 1960  Age: 64 y.o. MRN: [id number]  CC:  Chief Complaint  Patient presents with   New Patient (Initial Visit)    Establish care   Back Pain    Patient had a bad fall off a horse a couple years ago and has recently the past year had a lot of pains on right side.     HPI Michelle Huynh is a 64 y.o. female presents to establish care.  Previous pcp/physical/labs: Neale Carpen, NP  Discussed the use of AI scribe software for clinical note transcription with the patient, who gave verbal consent to proceed.  History of Present Illness Michelle Huynh is a 64 year old female who presents with chronic back pain following a fall from a horse.  She has been experiencing chronic back pain since falling off a horse a couple of years ago, which resulted in five broken ribs and a partially collapsed lung. The pain is described as a tense muscle sensation along the middle back, particularly on the right side, and is associated with tightness. She manages the pain primarily through massage therapy, yoga, and using a back roller. No recent imaging has been done since the initial hospital visit. No shortness of breath related to back pain.  She has a history of ulcerative colitis diagnosed in her early thirties, which was later re-evaluated and determined to be proctitis. She has been off medication for this condition for several years without any flare-ups.  She is currently taking rosuvastatin  20 mg daily for cholesterol management, which was started in 2022. She wants to reduce or discontinue this medication if possible, noting that her cholesterol levels were borderline when the medication was initiated.  She has a history of anxiety and is currently taking Celexa 30 mg daily, having increased the dose from 20 mg due to a rough past year involving personal losses and changes. She has adjusted her dosage based on  her symptoms and life events.  She reports that she has not been formally diagnosed with hypertension. Her blood pressure was noted to be slightly elevated during the visit, and she does not currently monitor it at home.  She has a history of a thyroid  nodule discovered during scans following her fall. She follows up annually with endocrinology for this condition.  She reports joint pain in her thumbs due to lack of cartilage, previously managed with cortisone injections and currently with thumb stabilizers. She has not pursued surgical options for this issue.     Outpatient Encounter Medications as of 07/15/2024  Medication Sig   Cholecalciferol (VITAMIN D3) 2000 units capsule Take 1 capsule by mouth daily.   citalopram (CELEXA) 20 MG tablet TAKE 1 AND 1/2 TABLETS BY MOUTH  AT BEDTIME   cyclobenzaprine  (FLEXERIL ) 5 MG tablet Take 0.5-1 tablets (2.5-5 mg total) by mouth at bedtime as needed for muscle spasms.   ELDERBERRY PO Take by mouth.   Multiple Vitamin (MULTIVITAMIN) capsule Take 1 capsule by mouth daily.   [DISCONTINUED] rosuvastatin  (CRESTOR ) 20 MG tablet TAKE 1 TABLET BY MOUTH EVERY  NIGHT AT BEDTIME   rosuvastatin  (CRESTOR ) 20 MG tablet Take 0.5 tablets (10 mg total) by mouth at bedtime.   [DISCONTINUED] amoxicillin -clavulanate (AUGMENTIN ) 875-125 MG tablet Take 1 tablet by mouth 2 (two) times daily.   [DISCONTINUED] phentermine  (ADIPEX-P ) 37.5 MG tablet Take 1 tablet (37.5 mg total) by mouth as  needed.   [DISCONTINUED] valACYclovir  (VALTREX ) 500 MG tablet Take 1 tablet twice daily for 3-5 days as needed   No facility-administered encounter medications on file as of 07/15/2024.    Past Medical History:  Diagnosis Date   Arthritis    thumbs   Colitis    COVID-19 09/14/2020   allergy type symptoms.  resolved   Depression    Seasonal allergies     Past Surgical History:  Procedure Laterality Date   COLONOSCOPY     COLONOSCOPY WITH PROPOFOL  N/A 10/21/2020   Procedure:  COLONOSCOPY WITH PROPOFOL ;  Surgeon: Jinny Carmine, MD;  Location: [facility];  Service: Endoscopy;  Laterality: N/A;  COVID + 09-14-20   ENDOMETRIAL ABLATION  04/2008   HER OPTION ABLATION   HYSTEROSCOPY  [id number]   POLYP    Family History  Problem Relation Age of Onset   Diabetes Mother    Heart disease Mother        hardening of arteries with bypass   Congestive Heart Failure Mother    Hypertension Mother    Pancreatitis Mother    Kidney disease Mother    Cancer Mother    Hypertension Father    Kidney disease Father    Depression Father    Cancer Father    Stroke Paternal Grandfather    Cancer Paternal Aunt    Breast cancer Neg Hx     Social History   Socioeconomic History   Marital status: Legally Separated    Spouse name: Not on file   Number of children: Not on file   Years of education: Not on file   Highest education level: Associate degree: academic program  Occupational History   Not on file  Tobacco Use   Smoking status: Former    Current packs/day: 0.00    Average packs/day: 1.5 packs/day for 20.0 years (30.0 ttl pk-yrs)    Types: Cigarettes    Start date: 09/09/1995    Quit date: 09/09/2015    Years since quitting: 8.8   Smokeless tobacco: Never  Vaping Use   Vaping status: Never Used  Substance and Sexual Activity   Alcohol use: Yes    Alcohol/week: 2.0 standard drinks of alcohol    Types: 2 Cans of beer per week   Drug use: Never   Sexual activity: Not Currently    Birth control/protection: None    Comment: ABLATION, INTERCOURSE AGE 14, SEXUAL PARTNERS LESS THAN 5  Other Topics Concern   Not on file  Social History Narrative   Not on file   Social Drivers of Health   Tobacco Use: Medium Risk (07/15/2024)   Patient History    Smoking Tobacco Use: Former    Smokeless Tobacco Use: Never    Passive Exposure: Not on file  Financial Resource Strain: Low Risk (07/13/2024)   Overall Financial Resource Strain (CARDIA)    Difficulty of  Paying Living Expenses: Not hard at all  Food Insecurity: No Food Insecurity (07/13/2024)   Epic    Worried About Radiation Protection Practitioner of Food in the Last Year: Never true    Ran Out of Food in the Last Year: Never true  Transportation Needs: No Transportation Needs (07/13/2024)   Epic    Lack of Transportation (Medical): No    Lack of Transportation (Non-Medical): No  Physical Activity: Sufficiently Active (07/13/2024)   Exercise Vital Sign    Days of Exercise per Week: 3 days    Minutes of Exercise per Session: 60 min  Stress:  No Stress Concern Present (07/13/2024)   [employer] of Occupational Health - Occupational Stress Questionnaire    Feeling of Stress: Not at all  Social Connections: Moderately Isolated (07/13/2024)   Social Connection and Isolation Panel    Frequency of Communication with Friends and Family: More than three times a week    Frequency of Social Gatherings with Friends and Family: Three times a week    Attends Religious Services: 1 to 4 times per year    Active Member of Clubs or Organizations: No    Attends Banker Meetings: Not on file    Marital Status: Separated  Intimate Partner Violence: Not on file  Depression (PHQ2-9): Low Risk (07/15/2024)   Depression (PHQ2-9)    PHQ-2 Score: 0  Alcohol Screen: Low Risk (07/13/2024)   Alcohol Screen    Last Alcohol Screening Score (AUDIT): 2  Housing: Low Risk (07/13/2024)   Epic    Unable to Pay for Housing in the Last Year: No    Number of Times Moved in the Last Year: 0    Homeless in the Last Year: No  Utilities: Not on file  Health Literacy: Not on file    Review of Systems  Constitutional:  Negative for chills and fever.  Respiratory:  Negative for shortness of breath.   Cardiovascular:  Negative for chest pain.  Gastrointestinal:  Negative for abdominal pain, constipation, diarrhea, heartburn, nausea and vomiting.  Genitourinary:  Negative for dysuria, frequency and urgency.  Neurological:   Negative for dizziness and headaches.  Endo/Heme/Allergies:  Negative for polydipsia.  Psychiatric/Behavioral:  Negative for depression and suicidal ideas. The patient is not nervous/anxious.         Objective    BP 136/82   Pulse 75   Temp 98 F (36.7 C) (Temporal)   Ht 5' 3.5 (1.613 m)   Wt 189 lb (85.7 kg)   SpO2 98%   BMI 32.95 kg/m   Physical Exam Vitals and nursing note reviewed.  Constitutional:      Appearance: Normal appearance.  Cardiovascular:     Rate and Rhythm: Normal rate and regular rhythm.     Pulses: Normal pulses.     Heart sounds: Normal heart sounds.  Pulmonary:     Effort: Pulmonary effort is normal.     Breath sounds: Normal breath sounds.  Musculoskeletal:     Thoracic back: Spasms present. No swelling, signs of trauma, lacerations, tenderness or bony tenderness. Normal range of motion.  Neurological:     Mental Status: She is alert and oriented to person, place, and time.  Psychiatric:        Mood and Affect: Mood normal.        Behavior: Behavior normal.        Thought Content: Thought content normal.        Judgment: Judgment normal.         Assessment & Plan:  Chronic right-sided thoracic back pain -     DG Thoracic Spine 2 View -     Cyclobenzaprine  HCl; Take 0.5-1 tablets (2.5-5 mg total) by mouth at bedtime as needed for muscle spasms.  Dispense: 30 tablet; Refill: 0  Establishing care with new doctor, encounter for Assessment & Plan: EMR reviewed briefly.     GAD (generalized anxiety disorder)  Mixed hyperlipidemia  Obesity (BMI 30.0-34.9) Assessment & Plan: Discussed the importance of healthy diet and exercise to affect sustainable weight loss.     Proctitis Assessment & Plan: Diagnosed years  ago.   Continue off meds.   Osteoarthritis of both thumbs  Thyroid  nodule Assessment & Plan: Following with endocrinology.  Reviewed ultrasound results in care everywhere. No concerns today.   Other orders -      Rosuvastatin  Calcium ; Take 0.5 tablets (10 mg total) by mouth at bedtime.    Assessment and Plan Assessment & Plan Chronic right-sided thoracic back pain Likely muscular in origin, possibly related to posture and previous trauma. No respiratory symptoms. Previous interventions provide some relief. - Ordered thoracic spine x-ray. - Prescribed Flexeril , starting with half a tablet at night, increasing to a full tablet if needed. - Consider referral to orthopedics. - Continue massage therapy and yoga.  Mixed hyperlipidemia Managed with rosuvastatin . She is interested in discontinuing medication.Joint pain not attributed to rosuvastatin . - Decreased rosuvastatin  to 10 mg daily. - Monitor cholesterol levels with blood work in six months.  Generalized anxiety disorder Managed with Celexa. Aware of need for gradual taper if discontinuation is considered. - Continue Celexa 30 mg daily. - Advised to notify if considering dose reduction or discontinuation.  Thumb osteoarthritis Cartilage loss. Previous cortisone injections ineffective. Uses thumb stabilizers. Declined surgery. - Continue using thumb stabilizers.   Return in about 2 weeks (around 07/29/2024) for physical and fasting labs.SABRA Carrol [place], NP  "

## 2024-07-15 NOTE — Assessment & Plan Note (Signed)
 Following with endocrinology.  Reviewed ultrasound results in care everywhere. No concerns today.

## 2024-07-15 NOTE — Addendum Note (Signed)
 Addended by: VINCENTE SHIVERS on: 07/15/2024 12:55 PM   Modules accepted: Orders

## 2024-07-15 NOTE — Patient Instructions (Addendum)
 Complete xray(s) prior to leaving today. I will notify you of your results once received.   Decrease Crestor  to 10 mg once daily.   Start flexeril  2.5-5 mg at bedtime as needed for muscle spasms.   Follow up in 2 weeks for physical and fasting labs.   It was a pleasure to meet you today! Please don't hesitate to contact me with any questions. Welcome to [facility]!

## 2024-07-16 ENCOUNTER — Ambulatory Visit: Payer: Self-pay | Admitting: General Practice

## 2024-07-16 DIAGNOSIS — G8929 Other chronic pain: Secondary | ICD-10-CM

## 2024-07-31 ENCOUNTER — Encounter: Admitting: General Practice
